# Patient Record
Sex: Male | Born: 1965
Health system: Southern US, Community
[De-identification: ages and names within clinical notes are randomized; demographics above are authoritative.]

## PROBLEM LIST (undated history)

## (undated) DIAGNOSIS — D497 Neoplasm of unspecified behavior of endocrine glands and other parts of nervous system: Secondary | ICD-10-CM

## (undated) DIAGNOSIS — D75A Glucose-6-phosphate dehydrogenase (G6PD) deficiency without anemia: Secondary | ICD-10-CM

## (undated) DIAGNOSIS — J302 Other seasonal allergic rhinitis: Secondary | ICD-10-CM

## (undated) DIAGNOSIS — M169 Osteoarthritis of hip, unspecified: Secondary | ICD-10-CM

## (undated) HISTORY — DX: Glucose-6-phosphate dehydrogenase (G6PD) deficiency without anemia: D75.A

---

## 2009-05-05 ENCOUNTER — Encounter: Payer: Self-pay | Admitting: Internal Medicine

## 2009-05-08 DIAGNOSIS — R9431 Abnormal electrocardiogram [ECG] [EKG]: Secondary | ICD-10-CM | POA: Insufficient documentation

## 2009-05-09 ENCOUNTER — Ambulatory Visit: Payer: Self-pay | Admitting: Internal Medicine

## 2009-05-12 ENCOUNTER — Ambulatory Visit (HOSPITAL_COMMUNITY): Admission: RE | Admit: 2009-05-12 | Discharge: 2009-05-12 | Payer: Self-pay | Admitting: Internal Medicine

## 2009-05-12 ENCOUNTER — Ambulatory Visit: Payer: Self-pay

## 2009-05-12 ENCOUNTER — Ambulatory Visit: Payer: Self-pay | Admitting: Cardiology

## 2009-05-12 ENCOUNTER — Encounter (HOSPITAL_COMMUNITY): Admission: RE | Admit: 2009-05-12 | Discharge: 2009-08-01 | Payer: Self-pay | Admitting: Internal Medicine

## 2010-05-26 NOTE — Assessment & Plan Note (Signed)
Summary: np6/dx:abnormal ekg/lg   Primary Provider:  [facility]  CC:  abnormal EKG.  History of Present Illness: Mr. Ivan [name] is a 45 y/o male with h/o G-6PD deficiency referred by Dr. Perrin Maltese for further evlaution of an abnormal ECG.  Denies history of HTN, HL or known CAD. Has never had a stress test or cath. No family history of heart disease. Remains fairly active.  Exercises regulalry on treadmill and ellipitical. On TM goes at 0.5% no CP or SOB with exercise. Did have some CP a while back which he related to GERD . No problems with swelling. No palpitations, syncope or presyncope. No FHX of sudden cardiac death.  Went for routine check-up and found to have abnormal ECG. Thinks he may have had an ECG 20 years ago for job interview but didn't know result  .  Preventive Screening-Counseling & Management  Alcohol-Tobacco     Smoking Status: never  Caffeine-Diet-Exercise     Does Patient Exercise: yes      Drug Use:  no.    Current Medications (verified): 1)  None  Allergies (verified): No Known Drug Allergies  Past History:  Past Medical History: G6PD deficiency GERD  Family History: Reviewed history and no changes required. father deceased with auto accident mother and sister alive no FHX of CAD of sudden cardiac death  Social History: Reviewed history and no changes required. Full Time -- pres of a Physiological scientist Married  -- with 4 children Tobacco Use - No.  Alcohol Use - yes Regular Exercise - yes -- 3 x's weekly Drug Use - no Smoking Status:  never Does Patient Exercise:  yes Drug Use:  no  Review of Systems       As per HPI and past medical history; otherwise all systems negative.   Vital Signs:  Patient profile:   45 year old male Height:      71 inches Weight:      256 pounds BMI:     35.83 Pulse rate:   74 / minute BP sitting:   112 / 78  (right arm) Cuff size:   large  Vitals Entered By: Hardin Negus, RMA (May 09, 2009 11:08  AM)   Impression & Recommendations:  Problem # 1:  ABNORMAL EKG (ICD-794.31) I suspect his ECG represents an abnormal repol pattern but does not have any HTN or other factors to explain. Will check echo to evaluate cardiac structure and function. Also needs screening treadmill, given abnormal ECG will need Myoview component.   Other Orders: EKG w/ Interpretation (93000) Echocardiogram (Echo) Nuclear Stress Test (Nuc Stress Test)  Patient Instructions: 1)  Your physician has requested that you have an echocardiogram.  Echocardiography is a painless test that uses sound waves to create images of your heart. It provides your doctor with information about the size and shape of your heart and how well your heart's chambers and valves are working.  This procedure takes approximately one hour. There are no restrictions for this procedure. 2)  Your physician has requested that you have an exercise stress myoview.  For further information please visit [email or web address]  Please follow instruction sheet, as given.

## 2010-05-26 NOTE — Assessment & Plan Note (Signed)
Summary: Cardiology Nuclear Study  Nuclear Med Background Indications for Stress Test: Evaluation for Ischemia, Abnormal EKG     Symptoms: Chest Pain, Chest Pressure    Nuclear Pre-Procedure Caffeine/Decaff Intake: None NPO After: 10:00 PM Lungs: Clear IV 0.9% NS with Angio Cath: 22g     IV Site: (R) Hand IV Started by: Irean Hong RN Chest Size (in) 54     Height (in): 71 Weight (lb): 253 BMI: 35.41  Nuclear Med Study 1 or 2 day study:  1 day     Stress Test Type:  Stress Reading MD:  Marca Ancona, MD     Referring MD:  D. Bensimhon Resting Radionuclide:  Technetium 41m Tetrofosmin     Resting Radionuclide Dose:  10.8 mCi  Stress Radionuclide:  Technetium 78m Tetrofosmin     Stress Radionuclide Dose:  33.0 mCi   Stress Protocol Exercise Time (min):  7:01 min     Max HR:  169 bpm     Predicted Max HR:  177 bpm  Max Systolic BP: 154 mm Hg     Percent Max HR:  95.48 %     METS: 8.6 Rate Pressure Product:  16109    Stress Test Technologist:  Irean Hong RN     Nuclear Technologist:  Harlow Asa CNMT  Rest Procedure  Myocardial perfusion imaging was performed at rest 45 minutes following the intravenous administration of Myoview Technetium 23m Tetrofosmin.  Stress Procedure  The patient exercised for 7 minutes and 01 second, RPE=15.  The patient stopped due to DOE, Fatigue,  and denied any chest pain.  There were  significant ST-T wave changes. The BP dropped to 125/60 immediately post exercise. Myoview was injected at peak exercise and myocardial perfusion imaging was performed after a brief delay.  QPS Raw Data Images:  Normal; no motion artifact; normal heart/lung ratio. Stress Images:  Mild mid anteroseptal perfusion defect.  Rest Images:  Mild mid anteroseptal perfusion defect Subtraction (SDS):  Fixede mid anteroseptal defect.  Transient Ischemic Dilatation:  .95  (Normal <1.22)  Lung/Heart Ratio:  .28  (Normal <0.45)  Quantitative Gated Spect Images QGS  EDV:  97 ml QGS ESV:  33 ml QGS EF:  66 % QGS cine images:  Normal wall motion.    Overall Impression  Exercise Capacity: Fair exercise capacity. BP Response: Normal blood pressure response. Clinical Symptoms: Fatigue and thigh tightness, no chest pain.  ECG Impression: Insignificant upsloping ST segment depression. Overall Impression: Fixed mild mid anteroseptal defect in the setting of normal wall motion may represent attenuation.  Cannot fullly rule out prior infarction, however.  No ischemia.   Appended Document: Cardiology Nuclear Study ok  Appended Document: Cardiology Nuclear Study Left message to call back   Appended Document: Cardiology Nuclear Study pts wife aware

## 2010-05-26 NOTE — Consult Note (Signed)
Summary: [facility] Ref[facility] Form  [facility] Ref[facility] Form   Imported By: Roderic Ovens 05/09/2009 15:58:55  _____________________________________________________________________  External Attachment:    Type:   Image     Comment:   External Document

## 2013-04-21 ENCOUNTER — Ambulatory Visit (INDEPENDENT_AMBULATORY_CARE_PROVIDER_SITE_OTHER): Payer: 59 | Admitting: Physician Assistant

## 2013-04-21 VITALS — BP 140/68 | HR 88 | Temp 98.1°F | Resp 18 | Ht 70.0 in | Wt 279.0 lb

## 2013-04-21 DIAGNOSIS — B9789 Other viral agents as the cause of diseases classified elsewhere: Secondary | ICD-10-CM

## 2013-04-21 DIAGNOSIS — R509 Fever, unspecified: Secondary | ICD-10-CM

## 2013-04-21 DIAGNOSIS — R05 Cough: Secondary | ICD-10-CM

## 2013-04-21 DIAGNOSIS — R0981 Nasal congestion: Secondary | ICD-10-CM

## 2013-04-21 DIAGNOSIS — J3489 Other specified disorders of nose and nasal sinuses: Secondary | ICD-10-CM

## 2013-04-21 DIAGNOSIS — R059 Cough, unspecified: Secondary | ICD-10-CM

## 2013-04-21 DIAGNOSIS — B349 Viral infection, unspecified: Secondary | ICD-10-CM

## 2013-04-21 LAB — POCT CBC
Granulocyte percent: 59.7 %G (ref 37–80)
HCT, POC: 41.1 % — AB (ref 43.5–53.7)
Hemoglobin: 12.4 g/dL — AB (ref 14.1–18.1)
Lymph, poc: 2.8 (ref 0.6–3.4)
MCH, POC: 28.8 pg (ref 27–31.2)
MCHC: 30.2 g/dL — AB (ref 31.8–35.4)
MCV: 95.3 fL (ref 80–97)
MID (cbc): 1 — AB (ref 0–0.9)
MPV: 8.8 fL (ref 0–99.8)
POC Granulocyte: 5.6 (ref 2–6.9)
POC LYMPH PERCENT: 30 %L (ref 10–50)
POC MID %: 10.3 %M (ref 0–12)
Platelet Count, POC: 279 10*3/uL (ref 142–424)
RBC: 4.31 M/uL — AB (ref 4.69–6.13)
RDW, POC: 13.2 %
WBC: 9.3 10*3/uL (ref 4.6–10.2)

## 2013-04-21 LAB — POCT INFLUENZA A/B
Influenza A, POC: NEGATIVE
Influenza B, POC: NEGATIVE

## 2013-04-21 MED ORDER — BENZONATATE 100 MG PO CAPS: 100.0000 mg | ORAL_CAPSULE | Freq: Three times a day (TID) | ORAL | Status: DC | PRN

## 2013-04-21 MED ORDER — HYDROCOD POLST-CHLORPHEN POLST 10-8 MG/5ML PO LQCR: 5.0000 mL | Freq: Two times a day (BID) | ORAL | Status: DC | PRN

## 2013-04-21 MED ORDER — AZITHROMYCIN 250 MG PO TABS: ORAL_TABLET | ORAL | Status: DC

## 2013-04-21 NOTE — Progress Notes (Signed)
Subjective:    Patient ID: RUFFIN [name], [name]    DOB: [birth year], 47 y.o.   MRN: [id number]  HPI 47 year old [name] presents with 3 day history of nasal congestion, fever, chills, body aches, productive cough, SOB, and fatigue.  Symptoms started suddenly on Ivan [name]/24/14 and have progressively worsened. He has had fevers up to 101.0 especially at night. Admits he seems to do better during the day and has been taking ibuprofen as needed for fever. Denies wheezing, chest pain, nausea, vomiting, sinus pain, otalgia, headache, or abdominal pain.  Did not get flu shot this year.  Patient is otherwise doing well with no other concerns today.     Review of Systems  Constitutional: Positive for fever and chills.  HENT: Positive for congestion, ear pain, postnasal drip and rhinorrhea. Negative for sinus pressure and sore throat.   Respiratory: Positive for cough and shortness of breath. Negative for chest tightness and wheezing.   Cardiovascular: Negative for chest pain.  Gastrointestinal: Negative for nausea, vomiting and abdominal pain.  Neurological: Negative for dizziness and headaches.       Objective:   Physical Exam  Constitutional: He is oriented to person, place, and time. He appears well-developed and well-nourished.  HENT:  Head: Normocephalic and atraumatic.  Right Ear: External ear normal.  Left Ear: External ear normal.  Mouth/Throat: Oropharynx is clear and moist. No oropharyngeal exudate.  Eyes: Conjunctivae are normal.  Neck: Normal range of motion. Neck supple.  Cardiovascular: Normal rate, regular rhythm and normal heart sounds.   Pulmonary/Chest: Effort normal and breath sounds normal.  Lymphadenopathy:    He has no cervical adenopathy.  Neurological: He is alert and oriented to person, place, and time.  Psychiatric: He has a normal mood and affect. His behavior is normal. Judgment and thought content normal.    Results for orders placed in visit on Ivan [name]/27/14  POCT CBC        Result Value Range   WBC 9.3  4.6 - 10.2 K/uL   Lymph, poc 2.8  0.6 - 3.4   POC LYMPH PERCENT 30.0  10 - 50 %L   MID (cbc) 1.0 (*) 0 - 0.9   POC MID % 10.3  0 - Ivan [name] %M   POC Granulocyte 5.6  2 - 6.9   Granulocyte percent 59.7  37 - 80 %G   RBC 4.31 (*) 4.69 - 6.13 M/uL   Hemoglobin Ivan [name].4 (*) 14.1 - 18.1 g/dL   HCT, POC 40.9 (*) 81.1 - 53.7 %   MCV 95.3  80 - 97 fL   MCH, POC 28.8  27 - 31.2 pg   MCHC 30.2 (*) 31.8 - 35.4 g/dL   RDW, POC 91.4     Platelet Count, POC 279  142 - 424 K/uL   MPV 8.8  0 - 99.8 fL  POCT INFLUENZA A/B      Result Value Range   Influenza A, POC Negative     Influenza B, POC Negative           Assessment & Plan:  Cough - Plan: POCT CBC, benzonatate (TESSALON) 100 MG capsule, chlorpheniramine-HYDROcodone (TUSSIONEX PENNKINETIC ER) 10-8 MG/5ML LQCR  Fever, unspecified - Plan: POCT CBC, POCT Influenza A/B  Flu test negative but very likely influenza. He is outside of the window to start Tamiflu Tussionex q12hours prn cough. Tessalon perles tid prn cough Increase fluids and rest Mucinex as directed to help with congestion.  Rx for Zpack to start if symptoms  fail to improve in 48-72 hours. He has been instructed to return if he develops worsening SOB, dark colored sputum, or chest pain.

## 2013-07-04 ENCOUNTER — Other Ambulatory Visit: Payer: Self-pay | Admitting: Family Medicine

## 2013-07-04 ENCOUNTER — Ambulatory Visit
Admission: RE | Admit: 2013-07-04 | Discharge: 2013-07-04 | Disposition: A | Discharge status: Home / Self Care | Payer: 59 | Source: Ambulatory Visit | Attending: Family Medicine | Admitting: Family Medicine

## 2013-07-04 DIAGNOSIS — E229 Hyperfunction of pituitary gland, unspecified: Principal | ICD-10-CM

## 2013-07-04 DIAGNOSIS — R7989 Other specified abnormal findings of blood chemistry: Secondary | ICD-10-CM

## 2013-07-04 MED ORDER — GADOBENATE DIMEGLUMINE 529 MG/ML IV SOLN
10.0000 mL | Freq: Once | INTRAVENOUS | Status: AC | PRN
  Administered 2013-07-04: 10 mL via INTRAVENOUS

## 2013-07-11 DIAGNOSIS — D352 Benign neoplasm of pituitary gland: Secondary | ICD-10-CM | POA: Insufficient documentation

## 2013-08-16 DIAGNOSIS — E291 Testicular hypofunction: Secondary | ICD-10-CM | POA: Insufficient documentation

## 2015-04-27 DIAGNOSIS — D497 Neoplasm of unspecified behavior of endocrine glands and other parts of nervous system: Secondary | ICD-10-CM

## 2015-04-27 HISTORY — DX: Neoplasm of unspecified behavior of endocrine glands and other parts of nervous system: D49.7

## 2016-01-07 ENCOUNTER — Ambulatory Visit (INDEPENDENT_AMBULATORY_CARE_PROVIDER_SITE_OTHER): Payer: 59 | Admitting: Internal Medicine

## 2016-01-07 DIAGNOSIS — Z23 Encounter for immunization: Secondary | ICD-10-CM | POA: Diagnosis not present

## 2016-01-07 DIAGNOSIS — Z7189 Other specified counseling: Secondary | ICD-10-CM | POA: Diagnosis not present

## 2016-01-07 DIAGNOSIS — Z789 Other specified health status: Secondary | ICD-10-CM | POA: Diagnosis not present

## 2016-01-07 DIAGNOSIS — Z9189 Other specified personal risk factors, not elsewhere classified: Secondary | ICD-10-CM

## 2016-01-07 DIAGNOSIS — IMO0002 Reserved for concepts with insufficient information to code with codable children: Secondary | ICD-10-CM

## 2016-01-07 DIAGNOSIS — Z0000 Encounter for general adult medical examination without abnormal findings: Secondary | ICD-10-CM

## 2016-01-07 MED ORDER — AZITHROMYCIN 500 MG PO TABS: 500.0000 mg | ORAL_TABLET | Freq: Every day | ORAL | 0 refills | Status: DC

## 2016-01-07 NOTE — Progress Notes (Signed)
   RFV: pre travel counseling for [place] Patient ID: Ivan [name], male   DOB: October 25, 1965, 50 y.o.   MRN: [id number]  HPI 50yo M who is traveling with his wife to [place]. He has previously traveled to [place], [place], [place], [place], and [place]. Including [place] and [place]  They will leave on 9/23- thru oct 5th. [place], [place], and [place]. Not staying in any malaria endemic areas  Outpatient Encounter Prescriptions as of 01/07/2016  Medication Sig  . azithromycin (ZITHROMAX) 250 MG tablet Take 2 tabs PO x 1 dose, then 1 tab PO QD x 4 days  . benzonatate (TESSALON) 100 MG capsule Take 1-2 capsules (100-200 mg total) by mouth 3 (three) times daily as needed for cough.  . chlorpheniramine-HYDROcodone (TUSSIONEX PENNKINETIC ER) 10-8 MG/5ML LQCR Take 5 mLs by mouth every 12 (twelve) hours as needed for cough (cough).   No facility-administered encounter medications on file as of 01/07/2016.      Patient Active Problem List   Diagnosis Date Noted  . ABNORMAL EKG 05/08/2009    Assessment and Plan  Pre travel vaccination = will give hep a , typhoid inj, and tdap  Traveler's diarrhea = will give rx for azithromycin  Gave precautions for mosquito bites, overal safety

## 2016-01-09 NOTE — Addendum Note (Signed)
Addended by: Landis Gandy on: 01/09/2016 11:42 AM   Modules accepted: Orders

## 2016-06-08 DIAGNOSIS — D352 Benign neoplasm of pituitary gland: Secondary | ICD-10-CM | POA: Diagnosis not present

## 2016-06-08 DIAGNOSIS — E669 Obesity, unspecified: Secondary | ICD-10-CM | POA: Insufficient documentation

## 2016-06-08 DIAGNOSIS — E291 Testicular hypofunction: Secondary | ICD-10-CM | POA: Diagnosis not present

## 2016-10-04 DIAGNOSIS — H5213 Myopia, bilateral: Secondary | ICD-10-CM | POA: Diagnosis not present

## 2016-12-07 DIAGNOSIS — D352 Benign neoplasm of pituitary gland: Secondary | ICD-10-CM | POA: Diagnosis not present

## 2016-12-07 DIAGNOSIS — E291 Testicular hypofunction: Secondary | ICD-10-CM | POA: Diagnosis not present

## 2016-12-07 DIAGNOSIS — R946 Abnormal results of thyroid function studies: Secondary | ICD-10-CM | POA: Diagnosis not present

## 2017-01-17 DIAGNOSIS — E237 Disorder of pituitary gland, unspecified: Secondary | ICD-10-CM | POA: Diagnosis not present

## 2017-01-17 DIAGNOSIS — D352 Benign neoplasm of pituitary gland: Secondary | ICD-10-CM | POA: Diagnosis not present

## 2017-08-30 DIAGNOSIS — M25552 Pain in left hip: Secondary | ICD-10-CM | POA: Diagnosis not present

## 2017-08-30 DIAGNOSIS — M1612 Unilateral primary osteoarthritis, left hip: Secondary | ICD-10-CM | POA: Diagnosis not present

## 2017-09-05 DIAGNOSIS — M1612 Unilateral primary osteoarthritis, left hip: Secondary | ICD-10-CM | POA: Diagnosis not present

## 2017-10-12 DIAGNOSIS — M1612 Unilateral primary osteoarthritis, left hip: Secondary | ICD-10-CM | POA: Diagnosis not present

## 2017-10-14 ENCOUNTER — Other Ambulatory Visit: Payer: Self-pay | Admitting: Orthopaedic Surgery

## 2017-11-10 NOTE — Pre-Procedure Instructions (Signed)
GOVANNI PLEMONS  11/10/2017      [id number] [address], [place] [address] Phone: [phone] Fax: [phone]    Your procedure is scheduled on Tues.,, November 22, 2017 from 7:30AM-9:49AM  Report to [facility] Admitting Entrance "A" at 5:30AM  Call this number if you have problems the morning of surgery:  [phone]   Remember:  Do not eat or drink after midnight on July 29th    Take these medicines the morning of surgery with A SIP OF WATER: None  7 days before surgery (7/23), stop taking all Other Aspirin Products, Vitamins, Fish oils, and Herbal medications. Also stop all NSAIDS i.e. Advil, Ibuprofen, Motrin, Aleve, Anaprox, Naproxen, BC, Goody Powders, and all Supplements.    Do not wear jewelry.  Do not wear lotions, powders, colognes, or deodorant.  Do not shave 48 hours prior to surgery.  Men may shave face.  Do not bring valuables to the hospital.  [facility] is not responsible for any belongings or valuables.  Contacts, dentures or bridgework may not be worn into surgery.  Leave your suitcase in the car.  After surgery it may be brought to your room.  For patients admitted to the hospital, discharge time will be determined by your treatment team.  Patients discharged the day of surgery will not be allowed to drive home.   Special instructions:  [facility]- Preparing For Surgery  Before surgery, you can play an important role. Because skin is not sterile, your skin needs to be as free of germs as possible. You can reduce the number of germs on your skin by washing with CHG (chlorahexidine gluconate) Soap before surgery.  CHG is an antiseptic cleaner which kills germs and bonds with the skin to continue killing germs even after washing.    Oral Hygiene is also important to reduce your risk of infection.  Remember - BRUSH YOUR TEETH THE MORNING OF SURGERY WITH YOUR REGULAR TOOTHPASTE  Please do  not use if you have an allergy to CHG or antibacterial soaps. If your skin becomes reddened/irritated stop using the CHG.  Do not shave (including legs and underarms) for at least 48 hours prior to first CHG shower. It is OK to shave your face.  Please follow these instructions carefully.   1. Shower the NIGHT BEFORE SURGERY and the MORNING OF SURGERY with CHG.   2. If you chose to wash your hair, wash your hair first as usual with your normal shampoo.  3. After you shampoo, rinse your hair and body thoroughly to remove the shampoo.  4. Use CHG as you would any other liquid soap. You can apply CHG directly to the skin and wash gently with a scrungie or a clean washcloth.   5. Apply the CHG Soap to your body ONLY FROM THE NECK DOWN.  Do not use on open wounds or open sores. Avoid contact with your eyes, ears, mouth and genitals (private parts). Wash Face and genitals (private parts)  with your normal soap.  6. Wash thoroughly, paying special attention to the area where your surgery will be performed.  7. Thoroughly rinse your body with warm water from the neck down.  8. DO NOT shower/wash with your normal soap after using and rinsing off the CHG Soap.  9. Pat yourself dry with a CLEAN TOWEL.  10. Wear CLEAN PAJAMAS to bed the night before surgery, wear comfortable clothes the morning of  surgery  11. Place CLEAN SHEETS on your bed the night of your first shower and DO NOT SLEEP WITH PETS.  Day of Surgery:  Do not apply any deodorants/lotions.  Please wear clean clothes to the hospital/surgery center.   Remember to brush your teeth WITH YOUR REGULAR TOOTHPASTE.  Please read over the following fact sheets that you were given. Pain Booklet, Coughing and Deep Breathing, MRSA Information and Surgical Site Infection Prevention

## 2017-11-11 ENCOUNTER — Encounter (HOSPITAL_COMMUNITY)
Admission: RE | Admit: 2017-11-11 | Discharge: 2017-11-11 | Disposition: A | Discharge status: Home / Self Care | Payer: 59 | Source: Ambulatory Visit | Attending: Orthopaedic Surgery | Admitting: Orthopaedic Surgery

## 2017-11-11 ENCOUNTER — Ambulatory Visit (HOSPITAL_COMMUNITY)
Admission: RE | Admit: 2017-11-11 | Discharge: 2017-11-11 | Disposition: A | Discharge status: Home / Self Care | Payer: 59 | Source: Ambulatory Visit | Attending: Orthopaedic Surgery | Admitting: Orthopaedic Surgery

## 2017-11-11 ENCOUNTER — Other Ambulatory Visit: Payer: Self-pay

## 2017-11-11 ENCOUNTER — Encounter (HOSPITAL_COMMUNITY): Payer: Self-pay

## 2017-11-11 DIAGNOSIS — E7401 von Gierke disease: Secondary | ICD-10-CM | POA: Diagnosis not present

## 2017-11-11 DIAGNOSIS — Z01818 Encounter for other preprocedural examination: Secondary | ICD-10-CM | POA: Insufficient documentation

## 2017-11-11 DIAGNOSIS — Z79899 Other long term (current) drug therapy: Secondary | ICD-10-CM | POA: Insufficient documentation

## 2017-11-11 DIAGNOSIS — Z0183 Encounter for blood typing: Secondary | ICD-10-CM | POA: Diagnosis not present

## 2017-11-11 DIAGNOSIS — I517 Cardiomegaly: Secondary | ICD-10-CM | POA: Insufficient documentation

## 2017-11-11 DIAGNOSIS — M1612 Unilateral primary osteoarthritis, left hip: Secondary | ICD-10-CM | POA: Diagnosis not present

## 2017-11-11 DIAGNOSIS — Z01812 Encounter for preprocedural laboratory examination: Secondary | ICD-10-CM | POA: Diagnosis not present

## 2017-11-11 HISTORY — DX: Osteoarthritis of hip, unspecified: M16.9

## 2017-11-11 LAB — BASIC METABOLIC PANEL
Anion gap: 11 (ref 5–15)
BUN: 15 mg/dL (ref 6–20)
CO2: 23 mmol/L (ref 22–32)
Calcium: 9.2 mg/dL (ref 8.9–10.3)
Chloride: 108 mmol/L (ref 98–111)
Creatinine, Ser: 1.11 mg/dL (ref 0.61–1.24)
GFR calc Af Amer: >60 mL/min (ref >60)
GFR calc non Af Amer: >60 mL/min (ref >60)
Glucose, Bld: 107 mg/dL — ABNORMAL HIGH (ref 70–99)
Potassium: 4.2 mmol/L (ref 3.5–5.1)
Sodium: 142 mmol/L (ref 135–145)

## 2017-11-11 LAB — CBC WITH DIFFERENTIAL/PLATELET
Abs Immature Granulocytes: 0 10*3/uL (ref 0.0–0.1)
Basophils Absolute: 0 10*3/uL (ref 0.0–0.1)
Basophils Relative: 1 %
Eosinophils Absolute: 0.2 10*3/uL (ref 0.0–0.7)
Eosinophils Relative: 4 %
HCT: 46 % (ref 39.0–52.0)
Hemoglobin: 14.7 g/dL (ref 13.0–17.0)
Immature Granulocytes: 0 %
Lymphocytes Relative: 32 %
Lymphs Abs: 1.6 10*3/uL (ref 0.7–4.0)
MCH: 29.5 pg (ref 26.0–34.0)
MCHC: 32 g/dL (ref 30.0–36.0)
MCV: 92.4 fL (ref 78.0–100.0)
Monocytes Absolute: 0.8 10*3/uL (ref 0.1–1.0)
Monocytes Relative: 15 %
Neutro Abs: 2.5 10*3/uL (ref 1.7–7.7)
Neutrophils Relative %: 48 %
Platelets: 237 10*3/uL (ref 150–400)
RBC: 4.98 MIL/uL (ref 4.22–5.81)
RDW: 12.7 % (ref 11.5–15.5)
WBC: 5.1 10*3/uL (ref 4.0–10.5)

## 2017-11-11 LAB — URINALYSIS, ROUTINE W REFLEX MICROSCOPIC
Bilirubin Urine: NEGATIVE
Glucose, UA: NEGATIVE mg/dL
Hgb urine dipstick: NEGATIVE
Ketones, ur: NEGATIVE mg/dL
Leukocytes, UA: NEGATIVE
Nitrite: NEGATIVE
Protein, ur: NEGATIVE mg/dL
Specific Gravity, Urine: 1.023 (ref 1.005–1.030)
pH: 5 (ref 5.0–8.0)

## 2017-11-11 LAB — PROTIME-INR
INR: 1.18
Prothrombin Time: 14.9 seconds (ref 11.4–15.2)

## 2017-11-11 LAB — ABO/RH: ABO/RH(D): B POS

## 2017-11-11 LAB — TYPE AND SCREEN
ABO/RH(D): B POS
Antibody Screen: NEGATIVE

## 2017-11-11 LAB — APTT: aPTT: 35 seconds (ref 24–36)

## 2017-11-11 LAB — SURGICAL PCR SCREEN
MRSA, PCR: NEGATIVE
Staphylococcus aureus: NEGATIVE

## 2017-11-11 NOTE — Progress Notes (Signed)
PCP - [facility]  Cardiologist - Denies  Chest x-ray - 11/11/17  EKG - 11/11/17  Stress Test - > 11yrs ago- Neg  ECHO - Denies  Cardiac Cath - Denies  Sleep Study - Denies CPAP - None  LABS- 11/11/17: CBC w/D, BMP, PT, PTT, T/S, UA  ASA- Denies  Anesthesia- Yes- EKG  Pt denies having chest pain, sob, or fever at this time. All instructions explained to the pt, with a verbal understanding of the material. Pt agrees to go over the instructions while at home for a better understanding. The opportunity to ask questions was provided.

## 2017-11-14 NOTE — Progress Notes (Signed)
Anesthesia Chart Review:  Case:  [id number] Date/Time:  11/22/17 0715   Procedure:  TOTAL HIP ARTHROPLASTY ANTERIOR APPROACH (Left )   Anesthesia type:  Spinal   Pre-op diagnosis:  LEFT HIP DEGENERATIVE JOINT DISEASE   Location:  [facility]   Surgeon:  Melrose Nakayama, MD      DISCUSSION: 52 yo male never smoker for above procedure. Pertinent medical hx includes G6PD deficiency.   I was asked to review chart due to abnormal EKG 11/11/2017 showing: Normal sinus rhythm, Left axis deviation, Left ventricular hypertrophy,ST & Marked T wave abnormality, consider anterolateral ischemia.   In 2011 he had a cardiac workup for eval of abnormal EKG that at that time also showed ST and T wave abnormalities indicative of lateral ischemia. He was asymptomatic and had no personal CAD history, EKG was done as part of routine exam. He saw Dr. Haroldine Laws had an echo showing EF of 55-60% and exercise stress test showing no ischemia.  EKG appears stable. He had previous cardiac workup with EF 55-60% and stress test negative for ischemia. Anticipate he can proceed with surgery as scheduled barring acute status change.   VS: BP (!) 139/95   Pulse 76   Temp 36.8 C   Resp 20   Ht 5\' 11"  (1.803 m)   Wt 259 lb (117.5 kg)   SpO2 98%   BMI 36.12 kg/m   PROVIDERS: Baruch Goldmann, PA-C is PCP   LABS: Labs reviewed: Acceptable for surgery. (all labs ordered are listed, but only abnormal results are displayed)  Labs Reviewed  BASIC METABOLIC PANEL - Abnormal; Notable for the following components:      Result Value   Glucose, Bld 107 (*)    All other components within normal limits  SURGICAL PCR SCREEN  APTT  CBC WITH DIFFERENTIAL/PLATELET  PROTIME-INR  URINALYSIS, ROUTINE W REFLEX MICROSCOPIC  TYPE AND SCREEN  ABO/RH     IMAGES: CHEST - 2 VIEW 11/11/2017  COMPARISON:  none  FINDINGS: Lungs clear.  Heart size and mediastinal contours are within normal limits.  No  effusion.  Visualized bones unremarkable.  IMPRESSION: No acute cardiopulmonary disease.   EKG: 11/11/2017: Normal sinus rhythm. Left axis deviation. Left ventricular hypertrophy. ST & Marked T wave abnormality, consider anterolateral ischemia  05/09/2009: Normal sinus rhythm.  Left axis deviation.  ST and T wave abnormality, consider anterolateral ischemia.  CV: ECHO 05/12/2009: Study conclusions -Left ventricle: Cavity size is normal.  Wall thickness was normal.  Systolic function was normal.  The estimated ejection fraction was in the range of 55 to 60%.  Wall motion was normal; there were no regional wall motion maladies.  Left ventricular diastolic function parameters were normal. -Aortic valve: There was no stenosis. -Mitral valve: Mild regurgitation. -Left atrium: Atrium normal in size. -Right ventricle: Cavity size is normal.  Systolic function was normal. -Pulmonary arteries: No TR Doppler jet so unable to estimate PA systolic pressure.  Impressions: -Normal LV size and systolic function, EF 55 to 60%.  Normal diastolic function.  Mild mitral regurgitation with a normal left atrial size.  Normal RV size and systolic function.  Exercise stress test 05/12/2009 Stress Procedure  The patient exercised for 7 minutes and 01 second, RPE=15.  The patient stopped due to DOE, Fatigue,  and denied any chest pain.  There were  significant ST-T wave changes. The BP dropped to 125/60 immediately post exercise. Myoview was injected at peak exercise and myocardial perfusion imaging was performed  after a brief delay.  QPS Raw Data Images:  Normal; no motion artifact; normal heart/lung ratio. Stress Images:  Mild mid anteroseptal perfusion defect.  Rest Images:  Mild mid anteroseptal perfusion defect Subtraction (SDS):  Fixede mid anteroseptal defect.  Transient Ischemic Dilatation:  .95  (Normal <1.22)  Lung/Heart Ratio:  .28  (Normal <0.45)  Quantitative Gated Spect Images QGS EDV:   97 ml QGS ESV:  33 ml QGS EF:  66 % QGS cine images:  Normal wall motion.    Overall Impression  Exercise Capacity: Fair exercise capacity. BP Response: Normal blood pressure response. Clinical Symptoms: Fatigue and thigh tightness, no chest pain.  ECG Impression: Insignificant upsloping ST segment depression. Overall Impression: Fixed mild mid anteroseptal defect in the setting of normal wall motion may represent attenuation.  Cannot fullly rule out prior infarction, however.  No ischemia.   Past Medical History:  Diagnosis Date  . Allergy   . Degenerative joint disease (DJD) of hip    Left    Past Surgical History:  Procedure Laterality Date  . NO PAST SURGERIES      MEDICATIONS: . cabergoline (DOSTINEX) 0.5 MG tablet  . ibuprofen (ADVIL,MOTRIN) 200 MG tablet  . Multiple Vitamin (MULTIVITAMIN) capsule  . TESTIM 50 MG/5GM (1%) GEL  . traMADol (ULTRAM) 50 MG tablet   No current facility-administered medications for this encounter.    Wynonia Musty [facility] Short Stay Center/Anesthesiology Phone [phone] 11/14/2017 1:39 PM

## 2017-11-17 NOTE — H&P (Signed)
TOTAL HIP ADMISSION H&P  Patient is admitted for left total hip arthroplasty.  Subjective:  Chief Complaint: left hip pain  HPI: Ivan [name], 52 y.o. male, has a history of pain and functional disability in the left hip(s) due to arthritis and patient has failed non-surgical conservative treatments for greater than 12 weeks to include NSAID's and/or analgesics, corticosteriod injections, flexibility and strengthening excercises, use of assistive devices, weight reduction as appropriate and activity modification.  Onset of symptoms was gradual starting 5 years ago with gradually worsening course since that time.The patient noted no past surgery on the left hip(s).  Patient currently rates pain in the left hip at 10 out of 10 with activity. Patient has night pain, worsening of pain with activity and weight bearing, trendelenberg gait, pain that interfers with activities of daily living and crepitus. Patient has evidence of subchondral cysts, subchondral sclerosis, periarticular osteophytes and joint space narrowing by imaging studies. This condition presents safety issues increasing the risk of falls. There is no current active infection.  Patient Active Problem List   Diagnosis Date Noted  . ABNORMAL EKG 05/08/2009   Past Medical History:  Diagnosis Date  . Allergy   . Degenerative joint disease (DJD) of hip    Left    Past Surgical History:  Procedure Laterality Date  . NO PAST SURGERIES      No current facility-administered medications for this encounter.    Current Outpatient Medications  Medication Sig Dispense Refill Last Dose  . cabergoline (DOSTINEX) 0.5 MG tablet Take 2 mg by mouth 2 (two) times a week. Take ONLY on Wed / Sat     . ibuprofen (ADVIL,MOTRIN) 200 MG tablet Take 800 mg by mouth every 6 (six) hours as needed for mild pain.     . Multiple Vitamin (MULTIVITAMIN) capsule Take 1 capsule by mouth daily.     . TESTIM 50 MG/5GM (1%) GEL USE 2 PACKETS DAILY  1   .  traMADol (ULTRAM) 50 MG tablet Take 1 tablet by mouth every 8 (eight) hours as needed for pain.  0 unknown   No Known Allergies  Social History   Tobacco Use  . Smoking status: Never Smoker  . Smokeless tobacco: Never Used  Substance Use Topics  . Alcohol use: Yes    Comment: occasional    Family History  Problem Relation Age of Onset  . Hypertension Mother   . Mental retardation Mother      Review of Systems  Musculoskeletal: Positive for joint pain.       Left hip  All other systems reviewed and are negative.   Objective:  Physical Exam  Constitutional: He is oriented to person, place, and time. He appears well-developed and well-nourished.  HENT:  Head: Normocephalic and atraumatic.  Eyes: Pupils are equal, round, and reactive to light.  Neck: Normal range of motion.  Cardiovascular: Normal rate and regular rhythm.  Respiratory: Effort normal.  GI: Soft.  Musculoskeletal:  Left hip motion is very painful and somewhat limited.  His leg lengths are roughly equal.  Sensation and motor function are intact distally.  He has palpable pulses in his feet.    Neurological: He is alert and oriented to person, place, and time.  Skin: Skin is warm and dry.  Psychiatric: He has a normal mood and affect. His behavior is normal. Judgment and thought content normal.    Vital signs in last 24 hours:    Labs:   Estimated body mass index is  36.12 kg/m as calculated from the following:   Height as of 11/11/17: 5\' 11"  (1.803 m).   Weight as of 11/11/17: 117.5 kg (259 lb).   Imaging Review Plain radiographs demonstrate severe degenerative joint disease of the left hip(s). The bone quality appears to be good for age and reported activity level.    Preoperative templating of the joint replacement has been completed, documented, and submitted to the Operating Room personnel in order to optimize intra-operative equipment management.     Assessment/Plan:  End stage primary  arthritis, left hip(s)  The patient history, physical examination, clinical judgement of the provider and imaging studies are consistent with end stage degenerative joint disease of the left hip(s) and total hip arthroplasty is deemed medically necessary. The treatment options including medical management, injection therapy, arthroscopy and arthroplasty were discussed at length. The risks and benefits of total hip arthroplasty were presented and reviewed. The risks due to aseptic loosening, infection, stiffness, dislocation/subluxation,  thromboembolic complications and other imponderables were discussed.  The patient acknowledged the explanation, agreed to proceed with the plan and consent was signed. Patient is being admitted for inpatient treatment for surgery, pain control, PT, OT, prophylactic antibiotics, VTE prophylaxis, progressive ambulation and ADL's and discharge planning.The patient is planning to be discharged home with home health services

## 2017-11-21 MED ORDER — TRANEXAMIC ACID 1000 MG/10ML IV SOLN
1000.0000 mg | INTRAVENOUS | Status: AC
  Administered 2017-11-22: 1000 mg via INTRAVENOUS
  Filled 2017-11-21: qty 1100

## 2017-11-21 MED ORDER — BUPIVACAINE LIPOSOME 1.3 % IJ SUSP
20.0000 mL | INTRAMUSCULAR | Status: AC
  Administered 2017-11-22: 20 mL
  Filled 2017-11-21: qty 20

## 2017-11-21 MED ORDER — SODIUM CHLORIDE 0.9 % IV SOLN
2000.0000 mg | INTRAVENOUS | Status: AC
  Administered 2017-11-22: 2000 mg via TOPICAL
  Filled 2017-11-21: qty 20

## 2017-11-21 MED ORDER — DEXTROSE 5 % IV SOLN
3.0000 g | INTRAVENOUS | Status: AC
  Administered 2017-11-22: 3 g via INTRAVENOUS
  Filled 2017-11-21: qty 3

## 2017-11-22 ENCOUNTER — Inpatient Hospital Stay (HOSPITAL_COMMUNITY)
Admission: RE | Admit: 2017-11-22 | Discharge: 2017-11-24 | DRG: 470 | Disposition: A | Discharge status: Home Health | Payer: 59 | Source: Ambulatory Visit | Attending: Orthopaedic Surgery | Admitting: Orthopaedic Surgery

## 2017-11-22 ENCOUNTER — Encounter (HOSPITAL_COMMUNITY)
Admission: RE | Disposition: A | Discharge status: Home Health | Payer: Self-pay | Source: Ambulatory Visit | Attending: Orthopaedic Surgery

## 2017-11-22 ENCOUNTER — Inpatient Hospital Stay (HOSPITAL_COMMUNITY): Payer: 59 | Admitting: Physician Assistant

## 2017-11-22 ENCOUNTER — Other Ambulatory Visit: Payer: Self-pay

## 2017-11-22 ENCOUNTER — Encounter (HOSPITAL_COMMUNITY): Payer: Self-pay | Admitting: General Practice

## 2017-11-22 ENCOUNTER — Inpatient Hospital Stay (HOSPITAL_COMMUNITY): Payer: 59

## 2017-11-22 ENCOUNTER — Inpatient Hospital Stay (HOSPITAL_COMMUNITY): Payer: 59 | Admitting: Anesthesiology

## 2017-11-22 DIAGNOSIS — M1612 Unilateral primary osteoarthritis, left hip: Principal | ICD-10-CM | POA: Diagnosis present

## 2017-11-22 DIAGNOSIS — Z96642 Presence of left artificial hip joint: Secondary | ICD-10-CM | POA: Diagnosis not present

## 2017-11-22 DIAGNOSIS — Z810 Family history of intellectual disabilities: Secondary | ICD-10-CM | POA: Diagnosis not present

## 2017-11-22 DIAGNOSIS — Z8249 Family history of ischemic heart disease and other diseases of the circulatory system: Secondary | ICD-10-CM

## 2017-11-22 DIAGNOSIS — Z6836 Body mass index (BMI) 36.0-36.9, adult: Secondary | ICD-10-CM

## 2017-11-22 DIAGNOSIS — Z419 Encounter for procedure for purposes other than remedying health state, unspecified: Secondary | ICD-10-CM

## 2017-11-22 DIAGNOSIS — E669 Obesity, unspecified: Secondary | ICD-10-CM | POA: Diagnosis not present

## 2017-11-22 DIAGNOSIS — Z471 Aftercare following joint replacement surgery: Secondary | ICD-10-CM | POA: Diagnosis not present

## 2017-11-22 DIAGNOSIS — M25552 Pain in left hip: Secondary | ICD-10-CM | POA: Diagnosis not present

## 2017-11-22 HISTORY — DX: Other seasonal allergic rhinitis: J30.2

## 2017-11-22 HISTORY — PX: TOTAL HIP ARTHROPLASTY: SHX124

## 2017-11-22 HISTORY — DX: Neoplasm of unspecified behavior of endocrine glands and other parts of nervous system: D49.7

## 2017-11-22 SURGERY — ARTHROPLASTY, HIP, TOTAL, ANTERIOR APPROACH
Anesthesia: Monitor Anesthesia Care | Laterality: Left

## 2017-11-22 MED ORDER — ONDANSETRON HCL 4 MG/2ML IJ SOLN: 4.0000 mg | Freq: Four times a day (QID) | INTRAMUSCULAR | Status: DC | PRN

## 2017-11-22 MED ORDER — CHLORHEXIDINE GLUCONATE 4 % EX LIQD: 60.0000 mL | Freq: Once | CUTANEOUS | Status: DC

## 2017-11-22 MED ORDER — CEFAZOLIN SODIUM-DEXTROSE 2-4 GM/100ML-% IV SOLN
INTRAVENOUS | Status: AC
  Filled 2017-11-22: qty 100

## 2017-11-22 MED ORDER — CEFAZOLIN SODIUM-DEXTROSE 2-4 GM/100ML-% IV SOLN
2.0000 g | Freq: Four times a day (QID) | INTRAVENOUS | Status: AC
  Administered 2017-11-22 (×2): 2 g via INTRAVENOUS
  Filled 2017-11-22: qty 100

## 2017-11-22 MED ORDER — HYDROCODONE-ACETAMINOPHEN 7.5-325 MG PO TABS: 1.0000 | ORAL_TABLET | ORAL | Status: DC | PRN

## 2017-11-22 MED ORDER — SODIUM CHLORIDE 0.9 % IV SOLN
INTRAVENOUS | Status: DC | PRN
  Administered 2017-11-22: 25 ug/min via INTRAVENOUS

## 2017-11-22 MED ORDER — PHENOL 1.4 % MT LIQD: 1.0000 | OROMUCOSAL | Status: DC | PRN

## 2017-11-22 MED ORDER — KETOROLAC TROMETHAMINE 15 MG/ML IJ SOLN
15.0000 mg | Freq: Four times a day (QID) | INTRAMUSCULAR | Status: AC
  Administered 2017-11-22 – 2017-11-23 (×4): 15 mg via INTRAVENOUS
  Filled 2017-11-22 (×3): qty 1

## 2017-11-22 MED ORDER — KETOROLAC TROMETHAMINE 15 MG/ML IJ SOLN
INTRAMUSCULAR | Status: AC
  Filled 2017-11-22: qty 1

## 2017-11-22 MED ORDER — ACETAMINOPHEN 500 MG PO TABS
500.0000 mg | ORAL_TABLET | Freq: Four times a day (QID) | ORAL | Status: AC
  Administered 2017-11-22 – 2017-11-23 (×3): 500 mg via ORAL
  Filled 2017-11-22 (×3): qty 1

## 2017-11-22 MED ORDER — MENTHOL 3 MG MT LOZG: 1.0000 | LOZENGE | OROMUCOSAL | Status: DC | PRN

## 2017-11-22 MED ORDER — LACTATED RINGERS IV SOLN: INTRAVENOUS | Status: DC

## 2017-11-22 MED ORDER — BISACODYL 5 MG PO TBEC: 5.0000 mg | DELAYED_RELEASE_TABLET | Freq: Every day | ORAL | Status: DC | PRN

## 2017-11-22 MED ORDER — MIDAZOLAM HCL 2 MG/2ML IJ SOLN
INTRAMUSCULAR | Status: DC | PRN
  Administered 2017-11-22: 2 mg via INTRAVENOUS

## 2017-11-22 MED ORDER — LACTATED RINGERS IV SOLN
INTRAVENOUS | Status: DC
  Administered 2017-11-22: 22:00:00 via INTRAVENOUS

## 2017-11-22 MED ORDER — PROPOFOL 10 MG/ML IV BOLUS
INTRAVENOUS | Status: AC
  Filled 2017-11-22: qty 20

## 2017-11-22 MED ORDER — DEXTROSE 5 % IV SOLN
500.0000 mg | Freq: Four times a day (QID) | INTRAVENOUS | Status: DC | PRN
  Filled 2017-11-22: qty 5

## 2017-11-22 MED ORDER — DIPHENHYDRAMINE HCL 12.5 MG/5ML PO ELIX: 12.5000 mg | ORAL_SOLUTION | ORAL | Status: DC | PRN

## 2017-11-22 MED ORDER — METOCLOPRAMIDE HCL 5 MG PO TABS: 5.0000 mg | ORAL_TABLET | Freq: Three times a day (TID) | ORAL | Status: DC | PRN

## 2017-11-22 MED ORDER — ALUM & MAG HYDROXIDE-SIMETH 200-200-20 MG/5ML PO SUSP: 30.0000 mL | ORAL | Status: DC | PRN

## 2017-11-22 MED ORDER — METOCLOPRAMIDE HCL 5 MG/ML IJ SOLN
5.0000 mg | Freq: Three times a day (TID) | INTRAMUSCULAR | Status: DC | PRN
  Administered 2017-11-22: 10 mg via INTRAVENOUS
  Filled 2017-11-22: qty 2

## 2017-11-22 MED ORDER — FENTANYL CITRATE (PF) 100 MCG/2ML IJ SOLN: 25.0000 ug | INTRAMUSCULAR | Status: DC | PRN

## 2017-11-22 MED ORDER — ONDANSETRON HCL 4 MG/2ML IJ SOLN
4.0000 mg | Freq: Once | INTRAMUSCULAR | Status: AC | PRN
  Administered 2017-11-22: 4 mg via INTRAVENOUS

## 2017-11-22 MED ORDER — ASPIRIN EC 325 MG PO TBEC
325.0000 mg | DELAYED_RELEASE_TABLET | Freq: Two times a day (BID) | ORAL | Status: DC
  Administered 2017-11-23 – 2017-11-24 (×3): 325 mg via ORAL
  Filled 2017-11-22 (×3): qty 1

## 2017-11-22 MED ORDER — ONDANSETRON HCL 4 MG/2ML IJ SOLN
INTRAMUSCULAR | Status: DC | PRN
  Administered 2017-11-22: 4 mg via INTRAVENOUS

## 2017-11-22 MED ORDER — FENTANYL CITRATE (PF) 250 MCG/5ML IJ SOLN
INTRAMUSCULAR | Status: DC | PRN
  Administered 2017-11-22: 50 ug via INTRAVENOUS

## 2017-11-22 MED ORDER — HYDROCODONE-ACETAMINOPHEN 5-325 MG PO TABS
1.0000 | ORAL_TABLET | ORAL | Status: DC | PRN
  Administered 2017-11-22 – 2017-11-24 (×6): 2 via ORAL
  Filled 2017-11-22 (×6): qty 2

## 2017-11-22 MED ORDER — MIDAZOLAM HCL 2 MG/2ML IJ SOLN
INTRAMUSCULAR | Status: AC
  Filled 2017-11-22: qty 2

## 2017-11-22 MED ORDER — ONDANSETRON HCL 4 MG/2ML IJ SOLN
INTRAMUSCULAR | Status: AC
  Filled 2017-11-22: qty 2

## 2017-11-22 MED ORDER — ONDANSETRON HCL 4 MG PO TABS: 4.0000 mg | ORAL_TABLET | Freq: Four times a day (QID) | ORAL | Status: DC | PRN

## 2017-11-22 MED ORDER — BUPIVACAINE-EPINEPHRINE (PF) 0.25% -1:200000 IJ SOLN
INTRAMUSCULAR | Status: AC
  Filled 2017-11-22: qty 30

## 2017-11-22 MED ORDER — 0.9 % SODIUM CHLORIDE (POUR BTL) OPTIME
TOPICAL | Status: DC | PRN
  Administered 2017-11-22: 1000 mL

## 2017-11-22 MED ORDER — MORPHINE SULFATE (PF) 2 MG/ML IV SOLN
0.5000 mg | INTRAVENOUS | Status: DC | PRN
  Administered 2017-11-22: 1 mg via INTRAVENOUS
  Filled 2017-11-22: qty 1

## 2017-11-22 MED ORDER — ACETAMINOPHEN 325 MG PO TABS
325.0000 mg | ORAL_TABLET | Freq: Four times a day (QID) | ORAL | Status: DC | PRN
  Administered 2017-11-23 (×2): 650 mg via ORAL
  Filled 2017-11-22 (×3): qty 2

## 2017-11-22 MED ORDER — FENTANYL CITRATE (PF) 250 MCG/5ML IJ SOLN
INTRAMUSCULAR | Status: AC
  Filled 2017-11-22: qty 5

## 2017-11-22 MED ORDER — BUPIVACAINE-EPINEPHRINE (PF) 0.25% -1:200000 IJ SOLN
INTRAMUSCULAR | Status: DC | PRN
  Administered 2017-11-22: 20 mL via PERINEURAL

## 2017-11-22 MED ORDER — METHOCARBAMOL 500 MG PO TABS
500.0000 mg | ORAL_TABLET | Freq: Four times a day (QID) | ORAL | Status: DC | PRN
  Administered 2017-11-22 – 2017-11-23 (×2): 500 mg via ORAL
  Filled 2017-11-22 (×2): qty 1

## 2017-11-22 MED ORDER — PROPOFOL 10 MG/ML IV BOLUS
INTRAVENOUS | Status: DC | PRN
  Administered 2017-11-22: 20 mg via INTRAVENOUS

## 2017-11-22 MED ORDER — DOCUSATE SODIUM 100 MG PO CAPS
100.0000 mg | ORAL_CAPSULE | Freq: Two times a day (BID) | ORAL | Status: DC
  Administered 2017-11-22 – 2017-11-24 (×4): 100 mg via ORAL
  Filled 2017-11-22 (×5): qty 1

## 2017-11-22 MED ORDER — PHENYLEPHRINE 40 MCG/ML (10ML) SYRINGE FOR IV PUSH (FOR BLOOD PRESSURE SUPPORT)
PREFILLED_SYRINGE | INTRAVENOUS | Status: DC | PRN
  Administered 2017-11-22: 80 ug via INTRAVENOUS

## 2017-11-22 MED ORDER — TRANEXAMIC ACID 1000 MG/10ML IV SOLN
1000.0000 mg | Freq: Once | INTRAVENOUS | Status: DC
  Filled 2017-11-22: qty 10

## 2017-11-22 MED ORDER — BUPIVACAINE IN DEXTROSE 0.75-8.25 % IT SOLN
INTRATHECAL | Status: DC | PRN
  Administered 2017-11-22: 2 mL via INTRATHECAL

## 2017-11-22 MED ORDER — LACTATED RINGERS IV SOLN
INTRAVENOUS | Status: DC | PRN
  Administered 2017-11-22: 07:00:00 via INTRAVENOUS

## 2017-11-22 MED ORDER — PROPOFOL 500 MG/50ML IV EMUL
INTRAVENOUS | Status: DC | PRN
  Administered 2017-11-22: 80 ug/kg/min via INTRAVENOUS

## 2017-11-22 SURGICAL SUPPLY — 49 items
BLADE SAW SGTL 18X1.27X75 (BLADE) ×2 IMPLANT
CAPT HIP TOTAL 2 ×1 IMPLANT
CELLS DAT CNTRL 66122 CELL SVR (MISCELLANEOUS) ×1 IMPLANT
COVER PERINEAL POST (MISCELLANEOUS) ×2 IMPLANT
COVER SURGICAL LIGHT HANDLE (MISCELLANEOUS) ×2 IMPLANT
DRAPE C-ARM 42X72 X-RAY (DRAPES) ×2 IMPLANT
DRAPE STERI IOBAN 125X83 (DRAPES) ×2 IMPLANT
DRAPE U-SHAPE 47X51 STRL (DRAPES) ×5 IMPLANT
DRESSING AQUACEL AG SP 3.5X6 (GAUZE/BANDAGES/DRESSINGS) IMPLANT
DRSG AQUACEL AG ADV 3.5X10 (GAUZE/BANDAGES/DRESSINGS) ×2 IMPLANT
DRSG AQUACEL AG SP 3.5X6 (GAUZE/BANDAGES/DRESSINGS) ×2
DRSG OPSITE POSTOP 4X6 (GAUZE/BANDAGES/DRESSINGS) ×2 IMPLANT
DURAPREP 26ML APPLICATOR (WOUND CARE) ×2 IMPLANT
ELECT BLADE 4.0 EZ CLEAN MEGAD (MISCELLANEOUS) ×2
ELECT REM PT RETURN 9FT ADLT (ELECTROSURGICAL) ×2
ELECTRODE BLDE 4.0 EZ CLN MEGD (MISCELLANEOUS) ×1 IMPLANT
ELECTRODE REM PT RTRN 9FT ADLT (ELECTROSURGICAL) ×1 IMPLANT
FACESHIELD WRAPAROUND (MASK) ×4 IMPLANT
FACESHIELD WRAPAROUND OR TEAM (MASK) ×2 IMPLANT
GLOVE BIO SURGEON STRL SZ8 (GLOVE) ×4 IMPLANT
GLOVE BIOGEL PI IND STRL 7.0 (GLOVE) IMPLANT
GLOVE BIOGEL PI IND STRL 8 (GLOVE) ×2 IMPLANT
GLOVE BIOGEL PI INDICATOR 7.0 (GLOVE) ×2
GLOVE BIOGEL PI INDICATOR 8 (GLOVE) ×2
GLOVE SURG SS PI 6.5 STRL IVOR (GLOVE) ×2 IMPLANT
GOWN STRL REUS W/ TWL LRG LVL3 (GOWN DISPOSABLE) ×1 IMPLANT
GOWN STRL REUS W/ TWL XL LVL3 (GOWN DISPOSABLE) ×2 IMPLANT
GOWN STRL REUS W/TWL LRG LVL3 (GOWN DISPOSABLE) ×2
GOWN STRL REUS W/TWL XL LVL3 (GOWN DISPOSABLE) ×4
HOOD PEEL AWAY FLYTE STAYCOOL (MISCELLANEOUS) ×1 IMPLANT
KIT BASIN OR (CUSTOM PROCEDURE TRAY) ×2 IMPLANT
KIT TURNOVER KIT B (KITS) ×2 IMPLANT
MANIFOLD NEPTUNE II (INSTRUMENTS) ×2 IMPLANT
NEEDLE HYPO 22GX1.5 SAFETY (NEEDLE) ×2 IMPLANT
NS IRRIG 1000ML POUR BTL (IV SOLUTION) ×2 IMPLANT
PACK TOTAL JOINT (CUSTOM PROCEDURE TRAY) ×2 IMPLANT
PAD ARMBOARD 7.5X6 YLW CONV (MISCELLANEOUS) ×2 IMPLANT
RETRACTOR WND ALEXIS 18 MED (MISCELLANEOUS) ×1 IMPLANT
RTRCTR WOUND ALEXIS 18CM MED (MISCELLANEOUS) ×2
SUT ETHIBOND NAB CT1 #1 30IN (SUTURE) ×5 IMPLANT
SUT VIC AB 1 CT1 27 (SUTURE) ×2
SUT VIC AB 1 CT1 27XBRD ANBCTR (SUTURE) ×1 IMPLANT
SUT VIC AB 2-0 CT1 27 (SUTURE) ×2
SUT VIC AB 2-0 CT1 TAPERPNT 27 (SUTURE) ×1 IMPLANT
SUT VIC AB 3-0 PS2 18 (SUTURE) ×2
SUT VIC AB 3-0 PS2 18XBRD (SUTURE) ×1 IMPLANT
SUT VLOC 180 0 24IN GS25 (SUTURE) ×2 IMPLANT
SYR 50ML LL SCALE MARK (SYRINGE) ×2 IMPLANT
TRAY CATH 16FR W/PLASTIC CATH (SET/KITS/TRAYS/PACK) ×1 IMPLANT

## 2017-11-22 NOTE — Op Note (Signed)
PRE-OP DIAGNOSIS:  LEFT HIP DEGENERATIVE JOINT DISEASE POST-OP DIAGNOSIS: same PROCEDURE:  LEFT TOTAL HIP ARTHROPLASTY ANTERIOR APPROACH ANESTHESIA:  Spinal and MAC SURGEON:  Melrose Nakayama MD ASSISTANT:  Loni Dolly PA-C   INDICATIONS FOR PROCEDURE:  The patient is a 52 y.o. male with a long history of a painful hip.  This has persisted despite multiple conservative measures.  The patient has persisted with pain and dysfunction making rest and activity difficult.  A total hip replacement is offered as surgical treatment.  Informed operative consent was obtained after discussion of possible complications including reaction to anesthesia, infection, neurovascular injury, dislocation, DVT, PE, and death.  The importance of the postoperative rehab program to optimize result was stressed with the patient.  SUMMARY OF FINDINGS AND PROCEDURE:  Under the above anesthesia through a anterior approach an the Hana table a left THR was performed.  The patient had severe degenerative change and excellent bone quality.  We used DePuy components to replace the hip and these were size 4 Actis femur capped with a +5 85m ceramic hip ball.  On the acetabular side we used a size 50 Gription shell with a plus 4 neutral polyethylene liner.  We did use a hole eliminator.  ALoni DollyPA-C assisted throughout and was invaluable to the completion of the case in that he helped position and retract while I performed the procedure.  He also closed simultaneously to help minimize OR time.  I used fluoroscopy throughout the case to check position of components and leg lengths and read all these views myself.  DESCRIPTION OF PROCEDURE:  The patient was taken to the OR suite where the above anesthetic was applied.  The patient was then positioned on the Hana table supine.  All bony prominences were appropriately padded.  Prep and drape was then performed in normal sterile fashion.  The patient was given kefzol preoperative antibiotic  and an appropriate time out was performed.  We then took an anterior approach to the left hip.  Dissection was taken through adipose to the tensor fascia lata fascia.  This structure was incised longitudinally and we dissected in the intermuscular interval just medial to this muscle.  Cobra retractors were placed superior and inferior to the femoral neck superficial to the capsule.  A capsular incision was then made and the retractors were placed along the femoral neck.  Xray was brought in to get a good level for the femoral neck cut which was made with an oscillating saw and osteotome.  The femoral head was removed with a corkscrew.  The acetabulum was exposed and some labral tissues were excised. Reaming was taken to the inside wall of the pelvis and sequentially up to 1 mm smaller than the actual component.  A trial of components was done and then the aforementioned acetabular shell was placed in appropriate tilt and anteversion confirmed by fluoroscopy. The liner was placed along with the hole eliminator and attention was turned to the femur.  The leg was brought down and over into adduction and the elevator bar was used to raise the femur up gently in the wound.  The piriformis was released with care taken to preserve the obturator internus attachment and all of the posterior capsule. The femur was reamed and then broached to the appropriate size.  A trial reduction was done and the aforementioned head and neck assembly gave uKoreathe best stability in extension with external rotation.  Leg lengths were felt to be about equal by fluoroscopic  exam.  The trial components were removed and the wound irrigated.  We then placed the femoral component in appropriate anteversion.  The head was applied to a dry stem neck and the hip again reduced.  It was again stable in the aforementioned position.  The would was irrigated again followed by re-approximation of anterior capsule with ethibond suture. Tensor fascia was  repaired with V-loc suture  followed by deep closure with #O and #2 undyed vicryl.  Skin was closed with subQ stitch and steristrips followed by a sterile dressing.  EBL and IOF can be obtained from anesthesia records.  DISPOSITION:  The patient was extubated in the OR and taken to PACU in stable condition to be admitted to the Orthopedic Surgery for appropriate post-op care to include perioperative antibiotics and DVT prophylaxis.

## 2017-11-22 NOTE — Evaluation (Signed)
Physical Therapy Evaluation Patient Details Name: Ivan [name] MRN: [id number] DOB: Aug 04, 1965 Today's Date: 11/22/2017   History of Present Illness  52 y.o. male admitted on 11/22/17 for elective L THA direct anterior approach.  Pt with no other significant PMH listed in his chart.   Clinical Impression  Pt is POD #0 and had a syncopal episode with PT while seated and while standing attempting to urinate.  RN in room during session and wife (who is an Charity fundraiser) was assisting.  Pt positioned back in bed, vitals stable and bed cleaned as he had a BM and vomited during episode.  RN continuing to assess and monitor pt when PT left. I anticipate, as he feels better, he will be able to progress home with his wife's assist.   PT to follow acutely for deficits listed below.      Follow Up Recommendations Follow surgeon's recommendation for DC plan and follow-up therapies;Supervision for mobility/OOB    Equipment Recommendations  Rolling walker with 5" wheels;3in1 (PT)    Recommendations for Other Services   NA    Precautions / Restrictions Precautions Precautions: Other (comment) Precaution Comments: passed out on eval, monitor vitals and pt closely.  Restrictions Weight Bearing Restrictions: Yes LLE Weight Bearing: Weight bearing as tolerated      Mobility  Bed Mobility Overal bed mobility: Needs Assistance Bed Mobility: Supine to Sit;Sit to Supine;Rolling Rolling: Mod assist   Supine to sit: Mod assist;HOB elevated Sit to supine: Total assist   General bed mobility comments: Mod assist to help progress left leg to EOB and separately help support trunk to come to sitting EOB.  Pt able to scoot out once seated, total assist of two to get pt back into the bed as he became unresponsive in sitting.  Mod assist to roll bil at end of session to change bed linens as pt had a small BM and vomited while sitting EOB.   Transfers Overall transfer level: Needs assistance Equipment used: Rolling  walker (2 wheeled) Transfers: Sit to/from Stand Sit to Stand: Mod assist;+2 physical assistance         General transfer comment: Mod assist to stand EOB to attempt to urinate.  Pt quickly became less responsive and therapist and wife had to forcefully assist him to sit down.  He remained responsive for a1-2 mins in seated and then became unresponsive again until positioned back in supine.  RN in room, vitals assessed and were stable.  Pt with increased temp and questionable urinary retention.  Wife is an Charity fundraiser and assisting at bedside.    Ambulation/Gait             General Gait Details: did not assess due to pt too lightheaded, N/V,  passed out, and had to be positioned back in supine in the bed.         Balance Overall balance assessment: Needs assistance Sitting-balance support: Feet supported;Bilateral upper extremity supported Sitting balance-Leahy Scale: Poor Sitting balance - Comments: min assist EOB.  Pt is hot to the touch   Standing balance support: Bilateral upper extremity supported Standing balance-Leahy Scale: Poor Standing balance comment: mod assist in standing with support of the RW.                              Pertinent Vitals/Pain Pain Assessment: Faces Faces Pain Scale: Hurts even more Pain Location: left hip with mobility.  Pain Descriptors / Indicators: Guarding;Grimacing Pain Intervention(s):  Limited activity within patient's tolerance;Monitored during session;Repositioned    Home Living Family/patient expects to be discharged to:: Private residence Living Arrangements: Spouse/significant other Available Help at Discharge: Family;Available 24 hours/day Type of Home: House Home Access: Stairs to enter Entrance Stairs-Rails: None Entrance Stairs-Number of Steps: 2 Home Layout: Two level;1/2 bath on main level;Bed/bath upstairs Home Equipment: None      Prior Function Level of Independence: Independent         Comments: owns his  own business, 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  Pt drives and enjoys swimming.      Hand Dominance   Dominant Hand: Right    Extremity/Trunk Assessment   Upper Extremity Assessment Upper Extremity Assessment: Overall WFL for tasks assessed    Lower Extremity Assessment Lower Extremity Assessment: LLE deficits/detail LLE Deficits / Details: left leg with normal post op pain and weakness, not specifically tested as pt needed to urinate urgently and then became lightheaded and unresponsive EOB.      Cervical / Trunk Assessment Cervical / Trunk Assessment: Normal  Communication   Communication: No difficulties  Cognition Arousal/Alertness: Lethargic Behavior During Therapy: WFL for tasks assessed/performed Overall Cognitive Status: (not specifically tested)                                 General Comments: Pt nauseated, lethargic, wanting to urinate, getting lighteheaded      General Comments      Exercises     Assessment/Plan    PT Assessment Patient needs continued PT services  PT Problem List Decreased strength;Decreased range of motion;Decreased activity tolerance;Decreased balance;Decreased mobility;Decreased knowledge of use of DME;Pain       PT Treatment Interventions DME instruction;Gait training;Stair training;Functional mobility training;Therapeutic activities;Therapeutic exercise;Balance training;Patient/family education;Manual techniques;Modalities    PT Goals (Current goals can be found in the Care Plan section)  Acute Rehab PT Goals Patient Stated Goal: wife intially wanted him to go home tomorrow, still hopeful.  PT Goal Formulation: With family Time For Goal Achievement: 11/29/17 Potential to Achieve Goals: Good    Frequency 7X/week           AM-PAC PT "6 Clicks" Daily Activity  Outcome Measure Difficulty turning over in bed (including adjusting bedclothes, sheets and blankets)?:  Unable Difficulty moving from lying on back to sitting on the side of the bed? : Unable Difficulty sitting down on and standing up from a chair with arms (e.g., wheelchair, bedside commode, etc,.)?: Unable Help needed moving to and from a bed to chair (including a wheelchair)?: Total Help needed walking in hospital room?: Total Help needed climbing 3-5 steps with a railing? : Total 6 Click Score: 6    End of Session   Activity Tolerance: Patient limited by lethargy;Patient limited by pain;Patient limited by fatigue Patient left: in bed;with call bell/phone within reach;with family/visitor present Nurse Communication: (RN in room entire session. ) PT Visit Diagnosis: Muscle weakness (generalized) (M62.81);Difficulty in walking, not elsewhere classified (R26.2);Pain Pain - Right/Left: Left Pain - part of body: Hip    Time: 4098-1191 PT Time Calculation (min) (ACUTE ONLY): 31 min   Charges:         Lurena Joiner B. Macenzie Burford, PT, DPT [phone]   PT Evaluation $PT Eval Low Complexity: 1 Low PT Treatments $Therapeutic Activity: 8-22 mins       11/22/2017, 5:51 PM

## 2017-11-22 NOTE — Anesthesia Procedure Notes (Signed)
Spinal  Patient location during procedure: OR Start time: 11/22/2017 7:35 AM End time: 11/22/2017 7:45 AM Staffing Anesthesiologist: Murvin Natal, MD Performed: anesthesiologist  Preanesthetic Checklist Completed: patient identified, surgical consent, pre-op evaluation, timeout performed, IV checked, risks and benefits discussed and monitors and equipment checked Spinal Block Patient position: sitting Prep: DuraPrep Patient monitoring: cardiac monitor, continuous pulse ox and blood pressure Approach: midline Location: L4-5 Injection technique: single-shot Needle Needle type: Pencan  Needle gauge: 24 G Needle length: 9 cm Assessment Sensory level: T10 Additional Notes Functioning IV was confirmed and monitors were applied. Sterile prep and drape, including hand hygiene and sterile gloves were used. The patient was positioned and the spine was prepped. The skin was anesthetized with lidocaine.  Free flow of clear CSF was obtained prior to injecting local anesthetic into the CSF.  The spinal needle aspirated freely following injection.  The needle was carefully withdrawn.  The patient tolerated the procedure well.

## 2017-11-22 NOTE — Anesthesia Postprocedure Evaluation (Signed)
Anesthesia Post Note  Patient: Ivan [name]  Procedure(s) Performed: TOTAL HIP ARTHROPLASTY ANTERIOR APPROACH (Left )     Patient location during evaluation: PACU Anesthesia Type: Spinal Level of consciousness: oriented and awake and alert Pain management: pain level controlled Vital Signs Assessment: post-procedure vital signs reviewed and stable Respiratory status: spontaneous breathing, respiratory function stable and patient connected to nasal cannula oxygen Cardiovascular status: blood pressure returned to baseline and stable Postop Assessment: no headache, no backache, no apparent nausea or vomiting and spinal receding Anesthetic complications: no    Last Vitals:  Vitals:   11/22/17 1215 11/22/17 1315  BP: (!) 106/91 117/68  Pulse: 74 88  Resp: 16 17  Temp: (!) 36.4 C   SpO2:  100%    Last Pain:  Vitals:   11/22/17 1315  TempSrc:   PainSc: 0-No pain                 Ryan P Ellender

## 2017-11-22 NOTE — Transfer of Care (Signed)
Immediate Anesthesia Transfer of Care Note  Patient: Ivan [name]  Procedure(s) Performed: TOTAL HIP ARTHROPLASTY ANTERIOR APPROACH (Left )  Patient Location: PACU  Anesthesia Type:General  Level of Consciousness: awake, alert  and oriented  Airway & Oxygen Therapy: Patient Spontanous Breathing and Patient connected to nasal cannula oxygen  Post-op Assessment: Report given to RN and Post -op Vital signs reviewed and stable  Post vital signs: Reviewed and stable  Last Vitals:  Vitals Value Taken Time  BP 98/63 11/22/2017  9:52 AM  Temp    Pulse    Resp 15 11/22/2017  9:53 AM  SpO2    Vitals shown include unvalidated device data.  Last Pain:  Vitals:   11/22/17 0633  TempSrc: Oral  PainSc: 5       Patients Stated Pain Goal: 3 (71/29/29 0903)  Complications: No apparent anesthesia complications

## 2017-11-22 NOTE — Anesthesia Preprocedure Evaluation (Addendum)
Anesthesia Evaluation  Patient identified by MRN, date of birth, ID band Patient awake    Reviewed: Allergy & Precautions, NPO status , Patient's Chart, lab work & pertinent test results  Airway Mallampati: IV  TM Distance: >3 FB Neck ROM: Full    Dental no notable dental hx.    Pulmonary neg pulmonary ROS,    Pulmonary exam normal breath sounds clear to auscultation       Cardiovascular negative cardio ROS Normal cardiovascular exam Rhythm:Regular Rate:Normal  ECG: NSR, LAD, LVH, rate 65   Neuro/Psych negative neurological ROS  negative psych ROS   GI/Hepatic negative GI ROS, Neg liver ROS,   Endo/Other  negative endocrine ROS  Renal/GU negative Renal ROS     Musculoskeletal  (+) Arthritis , Osteoarthritis,    Abdominal (+) + obese,   Peds  Hematology negative hematology ROS (+)   Anesthesia Other Findings LEFT HIP DEGENERATIVE JOINT DISEASE  Reproductive/Obstetrics                            Anesthesia Physical Anesthesia Plan  ASA: II  Anesthesia Plan: Spinal   Post-op Pain Management:    Induction: Intravenous  PONV Risk Score and Plan: 1 and Ondansetron, Dexamethasone, Propofol infusion, Treatment may vary due to age or medical condition and Midazolam  Airway Management Planned: Natural Airway  Additional Equipment:   Intra-op Plan:   Post-operative Plan:   Informed Consent: I have reviewed the patients History and Physical, chart, labs and discussed the procedure including the risks, benefits and alternatives for the proposed anesthesia with the patient or authorized representative who has indicated his/her understanding and acceptance.   Dental advisory given  Plan Discussed with: CRNA  Anesthesia Plan Comments:         Anesthesia Quick Evaluation

## 2017-11-22 NOTE — Progress Notes (Signed)
Ivan [name] is a 52 y.o. male patient admitted. Awake, alert - oriented  X 4 - no acute distress noted.  VSS - Blood pressure 124/80, pulse 97, temperature (Abnormal) 101.1 F (38.4 C), temperature source Oral, resp. rate 18, height 5\' 11"  (1.803 m), weight 117.5 kg (259 lb), SpO2 96 %.    IV in place, occlusive dsg intact without redness.  Loni Dolly, PA notified of temperature 101.1. Tylenol and IS encouraged. Bladder scanned patient with 60 cc of urine. Patient voided 150 cc.   Admission nurse notified of admission.     Will cont to eval and treat per MD orders.  Theora Master, RN 11/22/2017 6:25 PM

## 2017-11-22 NOTE — Interval H&P Note (Signed)
History and Physical Interval Note:  11/22/2017 7:13 AM  Ivan [name]  has presented today for surgery, with the diagnosis of LEFT HIP DEGENERATIVE JOINT DISEASE  The various methods of treatment have been discussed with the patient and family. After consideration of risks, benefits and other options for treatment, the patient has consented to  Procedure(s): TOTAL HIP ARTHROPLASTY ANTERIOR APPROACH (Left) as a surgical intervention .  The patient's history has been reviewed, patient examined, no change in status, stable for surgery.  I have reviewed the patient's chart and labs.  Questions were answered to the patient's satisfaction.     Erian Lariviere G

## 2017-11-23 ENCOUNTER — Encounter (HOSPITAL_COMMUNITY): Payer: Self-pay | Admitting: Orthopaedic Surgery

## 2017-11-23 LAB — CBC
HCT: 36.5 % — ABNORMAL LOW (ref 39.0–52.0)
Hemoglobin: 11.9 g/dL — ABNORMAL LOW (ref 13.0–17.0)
MCH: 29.6 pg (ref 26.0–34.0)
MCHC: 32.6 g/dL (ref 30.0–36.0)
MCV: 90.8 fL (ref 78.0–100.0)
Platelets: 161 10*3/uL (ref 150–400)
RBC: 4.02 MIL/uL — ABNORMAL LOW (ref 4.22–5.81)
RDW: 12.6 % (ref 11.5–15.5)
WBC: 9.5 10*3/uL (ref 4.0–10.5)

## 2017-11-23 NOTE — Progress Notes (Signed)
Physical Therapy Treatment Patient Details Name: Ivan [name] MRN: [id number] DOB: [birth year] Today's Date: 11/23/2017    History of Present Illness 52 y.o. male admitted on 11/22/17 for elective L THA direct anterior approach.  Pt with no other significant PMH listed in his chart.     PT Comments    Patient continues to make progress toward PT goals. Pt tolerated gait and stair training well and able to increase L LE weight bearing. Wife present and assisted with ascending/descending steps simulating home entrance. Pt will benefit from practicing stairs and review of HEP prior to d/c. Continue to progress as tolerated.    Follow Up Recommendations  Follow surgeon's recommendation for DC plan and follow-up therapies;Supervision for mobility/OOB     Equipment Recommendations  Rolling walker with 5" wheels;3in1 (PT)    Recommendations for Other Services       Precautions / Restrictions Restrictions Weight Bearing Restrictions: Yes LLE Weight Bearing: Weight bearing as tolerated    Mobility  Bed Mobility Overal bed mobility: Needs Assistance Bed Mobility: Supine to Sit;Sit to Supine     Supine to sit: HOB elevated;Min guard Sit to supine: Min guard   General bed mobility comments: pt able to bring bilat LE in/out of bed without physical assistance; increased time and effort and use of rail   Transfers Overall transfer level: Needs assistance Equipment used: Rolling walker (2 wheeled) Transfers: Sit to/from Stand Sit to Stand: Min guard         General transfer comment: min guard for safety  Ambulation/Gait Ambulation/Gait assistance: Min guard Gait Distance (Feet): 250 Feet Assistive device: Rolling walker (2 wheeled) Gait Pattern/deviations: Step-through pattern;Decreased stride length;Decreased weight shift to left(L LE circumduction) Gait velocity: decreased   General Gait Details: cues for increased L hip/knee flexion during swing phase; pt with decrease  in UE weight bearing this session   Stairs Stairs: Yes Stairs assistance: Min assist Stair Management: No rails;Step to pattern;Backwards;With walker Number of Stairs: 2 General stair comments: cues for sequencing and technique; wife stabilized RW   Wheelchair Mobility    Modified Rankin (Stroke Patients Only)       Balance Overall balance assessment: Needs assistance Sitting-balance support: Feet supported;Bilateral upper extremity supported Sitting balance-Leahy Scale: Fair     Standing balance support: Bilateral upper extremity supported;During functional activity Standing balance-Leahy Scale: Poor                              Cognition Arousal/Alertness: Awake/alert Behavior During Therapy: WFL for tasks assessed/performed Overall Cognitive Status: Within Functional Limits for tasks assessed                                        Exercises      General Comments        Pertinent Vitals/Pain Pain Assessment: 0-10 Pain Score: 6  Pain Location: L hip with mobility Pain Descriptors / Indicators: Guarding;Sore Pain Intervention(s): Limited activity within patient's tolerance;Monitored during session;Premedicated before session;Repositioned;Ice applied    Home Living                      Prior Function            PT Goals (current goals can now be found in the care plan section) Acute Rehab PT Goals PT Goal Formulation: With family Time For  Goal Achievement: 11/29/17 Potential to Achieve Goals: Good Progress towards PT goals: Progressing toward goals    Frequency    7X/week      PT Plan Current plan remains appropriate    Co-evaluation              AM-PAC PT "6 Clicks" Daily Activity  Outcome Measure  Difficulty turning over in bed (including adjusting bedclothes, sheets and blankets)?: Unable Difficulty moving from lying on back to sitting on the side of the bed? : Unable Difficulty sitting down on  and standing up from a chair with arms (e.g., wheelchair, bedside commode, etc,.)?: A Lot Help needed moving to and from a bed to chair (including a wheelchair)?: A Little Help needed walking in hospital room?: A Little Help needed climbing 3-5 steps with a railing? : A Little 6 Click Score: 13    End of Session Equipment Utilized During Treatment: Gait belt Activity Tolerance: Patient tolerated treatment well Patient left: with call bell/phone within reach;with family/visitor present;in chair Nurse Communication: Mobility status PT Visit Diagnosis: Muscle weakness (generalized) (M62.81);Difficulty in walking, not elsewhere classified (R26.2);Pain Pain - Right/Left: Left Pain - part of body: Hip     Time: 1410-1434 PT Time Calculation (min) (ACUTE ONLY): 24 min  Charges:  $Gait Training: 8-22 mins $Therapeutic Activity: 8-22 mins                     Earney Navy, PTA Pager: [phone]      Darliss Cheney 11/23/2017, 2:45 PM

## 2017-11-23 NOTE — Care Management Note (Addendum)
Case Management Note  Patient Details  Name: Ivan [name] MRN: [id number] Date of Birth: Jul 30, 1965  Subjective/Objective:                    Action/Plan:  Discussed discharge planning withpatient and wife [name] at bedside. Choice offered , they prefer [facility]. Gave referral to  [place] with [facility].  [employer] to deliver walker and 3 in 1 to room today Expected Discharge Date:                  Expected Discharge Plan:  [place]  In-House Referral:     Discharge planning Services  CM Consult  Post Acute Care Choice:  Home Health, Durable Medical Equipment Choice offered to:  Patient, Spouse  DME Arranged:  3-N-1, Walker rolling DME Agency:  [employer]  HH Arranged:  PT [place]:  [facility] [employer]  Status of Service:  Completed   If discussed at [employer] [facility] [employer], dates discussed:    Additional Comments:  Marilu Favre, RN 11/23/2017, 10:16 AM

## 2017-11-23 NOTE — Progress Notes (Signed)
Subjective: 1 Day Post-Op Procedure(s) (LRB): TOTAL HIP ARTHROPLASTY ANTERIOR APPROACH (Left)  Activity level:  wbat Diet tolerance:  ok Voiding:  ok Patient reports pain as mild.    Objective: Vital signs in last 24 hours: Temp:  [97 F (36.1 C)-102 F (38.9 C)] 102 F (38.9 C) (07/31 0544) Pulse Rate:  [50-107] 101 (07/31 0544) Resp:  [11-19] 18 (07/31 0544) BP: (83-158)/(51-91) 113/74 (07/31 0544) SpO2:  [95 %-100 %] 97 % (07/31 0544)  Labs: Recent Labs    11/23/17 0621  HGB 11.9*   Recent Labs    11/23/17 0621  WBC 9.5  RBC 4.02*  HCT 36.5*  PLT 161   No results for input(s): NA, K, CL, CO2, BUN, CREATININE, GLUCOSE, CALCIUM in the last 72 hours. No results for input(s): LABPT, INR in the last 72 hours.  Physical Exam:  Neurologically intact ABD soft Neurovascular intact Sensation intact distally Intact pulses distally Dorsiflexion/Plantar flexion intact Incision: dressing C/D/I and no drainage No cellulitis present Compartment soft  Assessment/Plan:  1 Day Post-Op Procedure(s) (LRB): TOTAL HIP ARTHROPLASTY ANTERIOR APPROACH (Left) Advance diet Up with therapy Plan for discharge tomorrow Discharge home with home health if doing well and cleared by PT. Continue on ASA 325mg  BID x 4 weeks post op. Patients CBC was normal this morning so I encouraged the use of IS and PO fluids. Fever is likely part of inflammatory process from THR Follow up in office 2 weeks post op.  Debborah Alonge, Larwance Sachs 11/23/2017, 7:45 AM

## 2017-11-23 NOTE — Progress Notes (Signed)
Patient with temperature of 101.8, PA Mitzi Hansen was made aware.

## 2017-11-23 NOTE — Progress Notes (Signed)
Orthopedic Tech Progress Note Patient Details:  Ivan [name] 1965/08/31 [id number]  Patient ID: [name], male   DOB: November 01, 1965, 52 y.o.   MRN: [id number] [employer] [facility]  Karolee Stamps 11/23/2017, 9:28 PM

## 2017-11-23 NOTE — Progress Notes (Signed)
Physical Therapy Treatment Patient Details Name: Ivan [name] MRN: [id number] DOB: January 15, 1966 Today's Date: 11/23/2017    History of Present Illness 52 y.o. male admitted on 11/22/17 for elective L THA direct anterior approach.  Pt with no other significant PMH listed in his chart.     PT Comments    Patient is progressing well toward PT goals. Pt tolerated session well and with no c/o dizziness. VSS with postural changes. Continue to progress as tolerated.    Follow Up Recommendations  Follow surgeon's recommendation for DC plan and follow-up therapies;Supervision for mobility/OOB     Equipment Recommendations  Rolling walker with 5" wheels;3in1 (PT)    Recommendations for Other Services       Precautions / Restrictions Restrictions Weight Bearing Restrictions: Yes LLE Weight Bearing: Weight bearing as tolerated    Mobility  Bed Mobility Overal bed mobility: Needs Assistance Bed Mobility: Supine to Sit     Supine to sit: HOB elevated;Min assist     General bed mobility comments: use of rail and min A to lift L LE for pt to bring to EOB; cues for sequencing   Transfers Overall transfer level: Needs assistance Equipment used: Rolling walker (2 wheeled) Transfers: Sit to/from Stand Sit to Stand: Min guard         General transfer comment: min guard for safety; cues for safe hand placement  Ambulation/Gait Ambulation/Gait assistance: Min guard Gait Distance (Feet): 200 Feet Assistive device: Rolling walker (2 wheeled) Gait Pattern/deviations: Step-through pattern;Decreased stride length;Decreased weight shift to left Gait velocity: decreased   General Gait Details: cues for posture and step length symmetry; pt with improved weightbearing and step through pattern with increased distance   Stairs             Wheelchair Mobility    Modified Rankin (Stroke Patients Only)       Balance Overall balance assessment: Needs assistance Sitting-balance  support: Feet supported;Bilateral upper extremity supported Sitting balance-Leahy Scale: Fair     Standing balance support: Bilateral upper extremity supported;During functional activity Standing balance-Leahy Scale: Poor                              Cognition Arousal/Alertness: Awake/alert Behavior During Therapy: WFL for tasks assessed/performed Overall Cognitive Status: Within Functional Limits for tasks assessed                                        Exercises      General Comments General comments (skin integrity, edema, etc.): VSS with postural changes and no c/o dizziness      Pertinent Vitals/Pain Pain Assessment: Faces Faces Pain Scale: Hurts little more Pain Location: L hip Pain Descriptors / Indicators: Guarding;Grimacing Pain Intervention(s): Limited activity within patient's tolerance;Monitored during session;Premedicated before session;Repositioned;Ice applied    Home Living                      Prior Function            PT Goals (current goals can now be found in the care plan section) Acute Rehab PT Goals PT Goal Formulation: With family Time For Goal Achievement: 11/29/17 Potential to Achieve Goals: Good Progress towards PT goals: Progressing toward goals    Frequency    7X/week      PT Plan Current plan remains appropriate  Co-evaluation              AM-PAC PT "6 Clicks" Daily Activity  Outcome Measure  Difficulty turning over in bed (including adjusting bedclothes, sheets and blankets)?: Unable Difficulty moving from lying on back to sitting on the side of the bed? : Unable Difficulty sitting down on and standing up from a chair with arms (e.g., wheelchair, bedside commode, etc,.)?: A Lot Help needed moving to and from a bed to chair (including a wheelchair)?: A Little Help needed walking in hospital room?: A Little Help needed climbing 3-5 steps with a railing? : A Little 6 Click Score:  13    End of Session Equipment Utilized During Treatment: Gait belt Activity Tolerance: Patient tolerated treatment well Patient left: with call bell/phone within reach;with family/visitor present;in chair Nurse Communication: Mobility status PT Visit Diagnosis: Muscle weakness (generalized) (M62.81);Difficulty in walking, not elsewhere classified (R26.2);Pain Pain - Right/Left: Left Pain - part of body: Hip     Time: 0925-1000 PT Time Calculation (min) (ACUTE ONLY): 35 min  Charges:  $Gait Training: 8-22 mins $Therapeutic Activity: 8-22 mins                     Earney Navy, PTA Pager: [phone]     Darliss Cheney 11/23/2017, 10:07 AM

## 2017-11-23 NOTE — Progress Notes (Addendum)
Pt's temp 102.0 this am. Notified Pricilla Holm PA on call for Dr. Rhona Raider. Stat CBC ordered and per Jed Limerick PA will be rounding on patient soon. Patient and his wife were informed about the lab and the PA rounding.

## 2017-11-24 MED ORDER — ONDANSETRON HCL 4 MG PO TABS: 4.0000 mg | ORAL_TABLET | Freq: Four times a day (QID) | ORAL | 0 refills | Status: DC | PRN

## 2017-11-24 MED ORDER — TIZANIDINE HCL 4 MG PO TABS: 4.0000 mg | ORAL_TABLET | Freq: Four times a day (QID) | ORAL | 1 refills | Status: AC | PRN

## 2017-11-24 MED ORDER — HYDROCODONE-ACETAMINOPHEN 7.5-325 MG PO TABS: 1.0000 | ORAL_TABLET | ORAL | 0 refills | Status: DC | PRN

## 2017-11-24 MED ORDER — BISACODYL 5 MG PO TBEC: 5.0000 mg | DELAYED_RELEASE_TABLET | Freq: Every day | ORAL | 0 refills | Status: DC | PRN

## 2017-11-24 MED ORDER — ASPIRIN 325 MG PO TBEC: 325.0000 mg | DELAYED_RELEASE_TABLET | Freq: Two times a day (BID) | ORAL | 0 refills | Status: DC

## 2017-11-24 MED ORDER — DOCUSATE SODIUM 100 MG PO CAPS: 100.0000 mg | ORAL_CAPSULE | Freq: Two times a day (BID) | ORAL | 0 refills | Status: DC

## 2017-11-24 NOTE — Progress Notes (Signed)
PT Cancellation Note  Patient Details Name: Ivan [name] MRN: [id number] DOB: [birth year]   Cancelled Treatment:    Reason Eval/Treat Not Completed: Other (comment). Asked by staff to see pt again for stair training, pt reports he feels comfortable with stairs and did not want to practice them again before being discharged. Noted from yesterdays PT session he required min A for 2 steps. If pt changes mind, PT will come back to attempt again.     Einar Crow 11/24/2017, 11:32 AM    Einar Crow, SPT  Student Physical Therapist Acute Rehab [phone]

## 2017-11-24 NOTE — Progress Notes (Signed)
Physical Therapy Treatment Patient Details Name: Ivan [name] MRN: [id number] DOB: 1965/12/14 Today's Date: 11/24/2017    History of Present Illness 52 y.o. male admitted on 11/22/17 for elective L THA direct anterior approach.  Pt with no other significant PMH listed in his chart.     PT Comments    Pt reports having just ambulated with mobility tech, and declines attempting stairs this session.  Reporting he's attempting to eat breakfast, but agreeable to performing HEP.  Provided pt with handout and reviewed via verbal/demonstration cues for 10 reps of exercises listed below.  Pt demonstrated each with min verbal cues for technique, pacing, and breathing.  Pt remains in recliner at end of session, call bell in reach, tray set up for breakfast.     Follow Up Recommendations  Follow surgeon's recommendation for DC plan and follow-up therapies;Supervision for mobility/OOB     Equipment Recommendations  Rolling walker with 5" wheels;3in1 (PT)    Recommendations for Other Services       Precautions / Restrictions Precautions Precautions: Anterior Hip Restrictions Weight Bearing Restrictions: Yes LLE Weight Bearing: Weight bearing as tolerated    Mobility  Bed Mobility                  Transfers                    Ambulation/Gait                 Stairs             Wheelchair Mobility    Modified Rankin (Stroke Patients Only)       Balance                                            Cognition Arousal/Alertness: Awake/alert Behavior During Therapy: WFL for tasks assessed/performed Overall Cognitive Status: Within Functional Limits for tasks assessed                                 General Comments: pt declining ambulation/stairs having just mobilized with mobility tech and attempting to eat breakfast      Exercises Total Joint Exercises Ankle Circles/Pumps: AROM;Both;10 reps;Seated Quad Sets:  AROM;Right;10 reps Gluteal Sets: AROM;Both;10 reps Heel Slides: AROM;Both;10 reps Hip ABduction/ADduction: AROM;10 reps;Both Straight Leg Raises: AROM;Both;10 reps Long Arc Quad: AROM;Both;10 reps    General Comments        Pertinent Vitals/Pain Pain Assessment: 0-10 Pain Score: 3  Pain Location: L hip with mobility Pain Descriptors / Indicators: Guarding;Sore Pain Intervention(s): Limited activity within patient's tolerance;Monitored during session    Home Living                      Prior Function            PT Goals (current goals can now be found in the care plan section) Acute Rehab PT Goals PT Goal Formulation: With family Time For Goal Achievement: 11/29/17 Potential to Achieve Goals: Good Progress towards PT goals: Progressing toward goals    Frequency    7X/week      PT Plan Current plan remains appropriate    Co-evaluation              AM-PAC PT "6 Clicks" Daily Activity  Outcome Measure  Difficulty turning over in bed (including adjusting bedclothes, sheets and blankets)?: A Lot Difficulty moving from lying on back to sitting on the side of the bed? : A Lot Difficulty sitting down on and standing up from a chair with arms (e.g., wheelchair, bedside commode, etc,.)?: A Lot Help needed moving to and from a bed to chair (including a wheelchair)?: A Little Help needed walking in hospital room?: A Little Help needed climbing 3-5 steps with a railing? : A Little 6 Click Score: 15    End of Session   Activity Tolerance: Patient limited by pain Patient left: in chair;with call bell/phone within reach;with family/visitor present Nurse Communication: Mobility status PT Visit Diagnosis: Muscle weakness (generalized) (M62.81);Difficulty in walking, not elsewhere classified (R26.2);Pain Pain - Right/Left: Left Pain - part of body: Hip     Time: 4103-0131 PT Time Calculation (min) (ACUTE ONLY): 10 min  Charges:  $Therapeutic Exercise:  8-22 mins                        Michel Santee 11/24/2017, 9:55 AM

## 2017-11-24 NOTE — Discharge Summary (Signed)
Patient ID: AWAB ABEBE MRN: [id number] DOB/AGE: 52-Oct-1967 52 y.o.  Admit date: 11/22/2017 Discharge date: 11/24/2017  Admission Diagnoses:  Principal Problem:   Primary localized osteoarthritis of left hip Active Problems:   Primary osteoarthritis of left hip   Discharge Diagnoses:  Same  Past Medical History:  Diagnosis Date  . Degenerative joint disease (DJD) of hip    Left  . Pituitary tumor 2017   "took RX to shrink it" (11/22/2017)  . Seasonal allergies    "spring; pollen" (11/22/2017)    Surgeries: Procedure(s): TOTAL HIP ARTHROPLASTY ANTERIOR APPROACH on 11/22/2017   Consultants:   Discharged Condition: Improved  Hospital Course: Ivan [name] is an 52 y.o. male who was admitted 11/22/2017 for operative treatment ofPrimary localized osteoarthritis of left hip. Patient has severe unremitting pain that affects sleep, daily activities, and work/hobbies. After pre-op clearance the patient was taken to the operating room on 11/22/2017 and underwent  Procedure(s): TOTAL HIP ARTHROPLASTY ANTERIOR APPROACH.    Patient was given perioperative antibiotics:  Anti-infectives (From admission, onward)   Start     Dose/Rate Route Frequency Ordered Stop   11/22/17 1630  ceFAZolin (ANCEF) IVPB 2g/100 mL premix     2 g 200 mL/hr over 30 Minutes Intravenous Every 6 hours 11/22/17 1617 11/22/17 2253   11/22/17 1614  ceFAZolin (ANCEF) 2-4 GM/100ML-% IVPB    Note to Pharmacy:  Valetta Mole   : cabinet override      11/22/17 1614 11/23/17 0429   11/22/17 0645  ceFAZolin (ANCEF) 3 g in dextrose 5 % 50 mL IVPB     3 g 100 mL/hr over 30 Minutes Intravenous To [facility] 11/21/17 1134 11/22/17 0745       Patient was given sequential compression devices, early ambulation, and chemoprophylaxis to prevent DVT.  Patient benefited maximally from hospital stay and there were no complications.    Recent vital signs:  Patient Vitals for the past 24 hrs:  BP Temp Temp src  Pulse Resp SpO2  11/24/17 1054 - (!) 100.8 F (38.2 C) Oral - - -  11/24/17 0912 - 100.2 F (37.9 C) Oral - - -  11/24/17 0601 (!) 145/94 99.5 F (37.5 C) Oral (!) 105 19 94 %  11/24/17 0324 - (!) 100.9 F (38.3 C) - - - -  11/23/17 2131 140/85 98.9 F (37.2 C) Oral (!) 106 18 95 %  11/23/17 2053 - 100 F (37.8 C) Oral - - -  11/23/17 1700 - (!) 101.8 F (38.8 C) Oral - - -  11/23/17 1615 - 100.2 F (37.9 C) - - - -  11/23/17 1414 137/80 100 F (37.8 C) Oral (!) 107 16 97 %     Recent laboratory studies:  Recent Labs    11/23/17 0621  WBC 9.5  HGB 11.9*  HCT 36.5*  PLT 161     Discharge Medications:   Allergies as of 11/24/2017   No Known Allergies     Medication List    STOP taking these medications   ibuprofen 200 MG tablet Commonly known as:  ADVIL,MOTRIN     TAKE these medications   aspirin 325 MG EC tablet Take 1 tablet (325 mg total) by mouth 2 (two) times daily after a meal.   bisacodyl 5 MG EC tablet Commonly known as:  DULCOLAX Take 1 tablet (5 mg total) by mouth daily as needed for moderate constipation.   cabergoline 0.5 MG tablet Commonly known as:  DOSTINEX Take 2 mg by  mouth 2 (two) times a week. Take ONLY on Wed / Sat   docusate sodium 100 MG capsule Commonly known as:  COLACE Take 1 capsule (100 mg total) by mouth 2 (two) times daily.   HYDROcodone-acetaminophen 7.5-325 MG tablet Commonly known as:  NORCO Take 1-2 tablets by mouth every 4 (four) hours as needed for severe pain (pain score 7-10).   multivitamin capsule Take 1 capsule by mouth daily.   ondansetron 4 MG tablet Commonly known as:  ZOFRAN Take 1 tablet (4 mg total) by mouth every 6 (six) hours as needed for nausea.   TESTIM 50 MG/5GM (1%) Gel Generic drug:  testosterone USE 2 PACKETS DAILY   tiZANidine 4 MG tablet Commonly known as:  ZANAFLEX Take 1 tablet (4 mg total) by mouth every 6 (six) hours as needed.   traMADol 50 MG tablet Commonly known as:   ULTRAM Take 1 tablet by mouth every 8 (eight) hours as needed for pain.            Durable Medical Equipment  (From admission, onward)        Start     Ordered   11/22/17 1729  DME Walker rolling  Once    Question:  Patient needs a walker to treat with the following condition  Answer:  Primary osteoarthritis of left hip   11/22/17 1728   11/22/17 1729  DME 3 n 1  Once     11/22/17 1728   11/22/17 1729  DME Bedside commode  Once    Question:  Patient needs a bedside commode to treat with the following condition  Answer:  Primary osteoarthritis of left hip   11/22/17 1728      Diagnostic Studies: Dg Chest 2 View  Result Date: 11/11/2017 CLINICAL DATA:  Pre admit for total hip surg,,no cardiac or lung dz,non smoker EXAM: CHEST - 2 VIEW COMPARISON:  none FINDINGS: Lungs clear. Heart size and mediastinal contours are within normal limits. No effusion. Visualized bones unremarkable. IMPRESSION: No acute cardiopulmonary disease. Electronically Signed   By: Lucrezia Europe M.D.   On: 11/11/2017 13:45   Dg C-arm 1-60 Min  Result Date: 11/22/2017 CLINICAL DATA:  52 year old male post hip arthroplasty. Initial encounter. EXAM: DG C-ARM 61-120 MIN; OPERATIVE LEFT HIP WITH PELVIS Fluoroscopic time: 21 seconds. COMPARISON:  None. FINDINGS: Two intraoperative C-arm views submitted for review after surgery. Post total left hip replacement which appears in satisfactory position without complication noted on frontal imaging. IMPRESSION: Post left hip replacement. Electronically Signed   By: Genia Del M.D.   On: 11/22/2017 09:32   Dg Hip Operative Unilat W Or W/o Pelvis Left  Result Date: 11/22/2017 CLINICAL DATA:  52 year old male post hip arthroplasty. Initial encounter. EXAM: DG C-ARM 61-120 MIN; OPERATIVE LEFT HIP WITH PELVIS Fluoroscopic time: 21 seconds. COMPARISON:  None. FINDINGS: Two intraoperative C-arm views submitted for review after surgery. Post total left hip replacement which appears  in satisfactory position without complication noted on frontal imaging. IMPRESSION: Post left hip replacement. Electronically Signed   By: Genia Del M.D.   On: 11/22/2017 09:32    Disposition: Discharge disposition: 01-Home or Self Care       Discharge Instructions    Call MD / Call 911   Complete by:  As directed    If you experience chest pain or shortness of breath, CALL 911 and be transported to the hospital emergency room.  If you develope a fever above 101 F, pus (white drainage) or  increased drainage or redness at the wound, or calf pain, call your surgeon's office.   Constipation Prevention   Complete by:  As directed    Drink plenty of fluids.  Prune juice may be helpful.  You may use a stool softener, such as Colace (over the counter) 100 mg twice a day.  Use MiraLax (over the counter) for constipation as needed.   Diet - low sodium heart healthy   Complete by:  As directed    Discharge instructions   Complete by:  As directed    INSTRUCTIONS AFTER JOINT REPLACEMENT   Remove items at home which could result in a fall. This includes throw rugs or furniture in walking pathways ICE to the affected joint every three hours while awake for 30 minutes at a time, for at least the first 3-5 days, and then as needed for pain and swelling.  Continue to use ice for pain and swelling. You may notice swelling that will progress down to the foot and ankle.  This is normal after surgery.  Elevate your leg when you are not up walking on it.   Continue to use the breathing machine you got in the hospital (incentive spirometer) which will help keep your temperature down.  It is common for your temperature to cycle up and down following surgery, especially at night when you are not up moving around and exerting yourself.  The breathing machine keeps your lungs expanded and your temperature down.   DIET:  As you were doing prior to hospitalization, we recommend a well-balanced diet.  DRESSING  / WOUND CARE / SHOWERING  You may shower 3 days after surgery, but keep the wounds dry during showering.  You may use an occlusive plastic wrap (Press'n Seal for example), NO SOAKING/SUBMERGING IN THE BATHTUB.  If the bandage gets wet, change with a clean dry gauze.  If the incision gets wet, pat the wound dry with a clean towel.  ACTIVITY  Increase activity slowly as tolerated, but follow the weight bearing instructions below.   No driving for 6 weeks or until further direction given by your physician.  You cannot drive while taking narcotics.  No lifting or carrying greater than 10 lbs. until further directed by your surgeon. Avoid periods of inactivity such as sitting longer than an hour when not asleep. This helps prevent blood clots.  You may return to work once you are authorized by your doctor.     WEIGHT BEARING   Weight bearing as tolerated with assist device (walker, cane, etc) as directed, use it as long as suggested by your surgeon or therapist, typically at least 4-6 weeks.   EXERCISES  Results after joint replacement surgery are often greatly improved when you follow the exercise, range of motion and muscle strengthening exercises prescribed by your doctor. Safety measures are also important to protect the joint from further injury. Any time any of these exercises cause you to have increased pain or swelling, decrease what you are doing until you are comfortable again and then slowly increase them. If you have problems or questions, call your caregiver or physical therapist for advice.   Rehabilitation is important following a joint replacement. After just a few days of immobilization, the muscles of the leg can become weakened and shrink (atrophy).  These exercises are designed to build up the tone and strength of the thigh and leg muscles and to improve motion. Often times heat used for twenty to thirty minutes before working out  will loosen up your tissues and help with  improving the range of motion but do not use heat for the first two weeks following surgery (sometimes heat can increase post-operative swelling).   These exercises can be done on a training (exercise) mat, on the floor, on a table or on a bed. Use whatever works the best and is most comfortable for you.    Use music or television while you are exercising so that the exercises are a pleasant break in your day. This will make your life better with the exercises acting as a break in your routine that you can look forward to.   Perform all exercises about fifteen times, three times per day or as directed.  You should exercise both the operative leg and the other leg as well.   Exercises include:   Quad Sets - Tighten up the muscle on the front of the thigh (Quad) and hold for 5-10 seconds.   Straight Leg Raises - With your knee straight (if you were given a brace, keep it on), lift the leg to 60 degrees, hold for 3 seconds, and slowly lower the leg.  Perform this exercise against resistance later as your leg gets stronger.  Leg Slides: Lying on your back, slowly slide your foot toward your buttocks, bending your knee up off the floor (only go as far as is comfortable). Then slowly slide your foot back down until your leg is flat on the floor again.  Angel Wings: Lying on your back spread your legs to the side as far apart as you can without causing discomfort.  Hamstring Strength:  Lying on your back, push your heel against the floor with your leg straight by tightening up the muscles of your buttocks.  Repeat, but this time bend your knee to a comfortable angle, and push your heel against the floor.  You may put a pillow under the heel to make it more comfortable if necessary.   A rehabilitation program following joint replacement surgery can speed recovery and prevent re-injury in the future due to weakened muscles. Contact your doctor or a physical therapist for more information on knee rehabilitation.     CONSTIPATION  Constipation is defined medically as fewer than three stools per week and severe constipation as less than one stool per week.  Even if you have a regular bowel pattern at home, your normal regimen is likely to be disrupted due to multiple reasons following surgery.  Combination of anesthesia, postoperative narcotics, change in appetite and fluid intake all can affect your bowels.   YOU MUST use at least one of the following options; they are listed in order of increasing strength to get the job done.  They are all available over the counter, and you may need to use some, POSSIBLY even all of these options:    Drink plenty of fluids (prune juice may be helpful) and high fiber foods Colace 100 mg by mouth twice a day  Senokot for constipation as directed and as needed Dulcolax (bisacodyl), take with full glass of water  Miralax (polyethylene glycol) once or twice a day as needed.  If you have tried all these things and are unable to have a bowel movement in the first 3-4 days after surgery call either your surgeon or your primary doctor.    If you experience loose stools or diarrhea, hold the medications until you stool forms back up.  If your symptoms do not get better within 1 week or  if they get worse, check with your doctor.  If you experience "the worst abdominal pain ever" or develop nausea or vomiting, please contact the office immediately for further recommendations for treatment.   ITCHING:  If you experience itching with your medications, try taking only a single pain pill, or even half a pain pill at a time.  You can also use Benadryl over the counter for itching or also to help with sleep.   TED HOSE STOCKINGS:  Use stockings on both legs until for at least 2 weeks or as directed by physician office. They may be removed at night for sleeping.  MEDICATIONS:  See your medication summary on the "After Visit Summary" that nursing will review with you.  You may have some  home medications which will be placed on hold until you complete the course of blood thinner medication.  It is important for you to complete the blood thinner medication as prescribed.  PRECAUTIONS:  If you experience chest pain or shortness of breath - call 911 immediately for transfer to the hospital emergency department.   If you develop a fever greater that 101 F, purulent drainage from wound, increased redness or drainage from wound, foul odor from the wound/dressing, or calf pain - CONTACT YOUR SURGEON.                                                   FOLLOW-UP APPOINTMENTS:  If you do not already have a post-op appointment, please call the office for an appointment to be seen by your surgeon.  Guidelines for how soon to be seen are listed in your "After Visit Summary", but are typically between 1-4 weeks after surgery.  OTHER INSTRUCTIONS:   Knee Replacement:  Do not place pillow under knee, focus on keeping the knee straight while resting. CPM instructions: 0-90 degrees, 2 hours in the morning, 2 hours in the afternoon, and 2 hours in the evening. Place foam block, curve side up under heel at all times except when in CPM or when walking.  DO NOT modify, tear, cut, or change the foam block in any way.  MAKE SURE YOU:  Understand these instructions.  Get help right away if you are not doing well or get worse.    Thank you for letting us be a part of your medical care team.  It is a privilege we respect greatly.  We hope these instructions will help you stay on track for a fast and full recovery!   Increase activity slowly as tolerated   Complete by:  As directed       Follow-up Information    Melrose Nakayama, MD. Schedule an appointment as soon as possible for a visit in 2 weeks.   Specialty:  Orthopedic Surgery Contact information: [place] [address] [phone]        [facility] Follow up.   Specialty:  [facility] Contact  information: [address] [address] [phone]            Signed: Rich Fuchs 11/24/2017, 11:58 AM

## 2017-11-24 NOTE — Progress Notes (Signed)
Discharge instructions (including medications) discussed with and copy provided to patient/caregiver 

## 2017-11-25 DIAGNOSIS — Z7982 Long term (current) use of aspirin: Secondary | ICD-10-CM | POA: Diagnosis not present

## 2017-11-25 DIAGNOSIS — Z96642 Presence of left artificial hip joint: Secondary | ICD-10-CM | POA: Diagnosis not present

## 2017-11-25 DIAGNOSIS — Z471 Aftercare following joint replacement surgery: Secondary | ICD-10-CM | POA: Diagnosis not present

## 2017-11-28 DIAGNOSIS — Z7982 Long term (current) use of aspirin: Secondary | ICD-10-CM | POA: Diagnosis not present

## 2017-11-28 DIAGNOSIS — Z471 Aftercare following joint replacement surgery: Secondary | ICD-10-CM | POA: Diagnosis not present

## 2017-11-28 DIAGNOSIS — Z96642 Presence of left artificial hip joint: Secondary | ICD-10-CM | POA: Diagnosis not present

## 2017-11-30 DIAGNOSIS — Z471 Aftercare following joint replacement surgery: Secondary | ICD-10-CM | POA: Diagnosis not present

## 2017-11-30 DIAGNOSIS — Z96642 Presence of left artificial hip joint: Secondary | ICD-10-CM | POA: Diagnosis not present

## 2017-11-30 DIAGNOSIS — Z7982 Long term (current) use of aspirin: Secondary | ICD-10-CM | POA: Diagnosis not present

## 2017-12-02 DIAGNOSIS — Z471 Aftercare following joint replacement surgery: Secondary | ICD-10-CM | POA: Diagnosis not present

## 2017-12-02 DIAGNOSIS — Z7982 Long term (current) use of aspirin: Secondary | ICD-10-CM | POA: Diagnosis not present

## 2017-12-02 DIAGNOSIS — M170 Bilateral primary osteoarthritis of knee: Secondary | ICD-10-CM | POA: Diagnosis not present

## 2017-12-02 DIAGNOSIS — Z96642 Presence of left artificial hip joint: Secondary | ICD-10-CM | POA: Diagnosis not present

## 2017-12-06 DIAGNOSIS — Z96642 Presence of left artificial hip joint: Secondary | ICD-10-CM | POA: Diagnosis not present

## 2017-12-06 DIAGNOSIS — Z7982 Long term (current) use of aspirin: Secondary | ICD-10-CM | POA: Diagnosis not present

## 2017-12-06 DIAGNOSIS — Z471 Aftercare following joint replacement surgery: Secondary | ICD-10-CM | POA: Diagnosis not present

## 2017-12-07 DIAGNOSIS — Z471 Aftercare following joint replacement surgery: Secondary | ICD-10-CM | POA: Diagnosis not present

## 2017-12-07 DIAGNOSIS — Z96642 Presence of left artificial hip joint: Secondary | ICD-10-CM | POA: Diagnosis not present

## 2017-12-07 DIAGNOSIS — R262 Difficulty in walking, not elsewhere classified: Secondary | ICD-10-CM | POA: Diagnosis not present

## 2017-12-13 DIAGNOSIS — Z471 Aftercare following joint replacement surgery: Secondary | ICD-10-CM | POA: Diagnosis not present

## 2017-12-13 DIAGNOSIS — R262 Difficulty in walking, not elsewhere classified: Secondary | ICD-10-CM | POA: Diagnosis not present

## 2017-12-13 DIAGNOSIS — Z96642 Presence of left artificial hip joint: Secondary | ICD-10-CM | POA: Diagnosis not present

## 2018-01-04 DIAGNOSIS — Z0000 Encounter for general adult medical examination without abnormal findings: Secondary | ICD-10-CM | POA: Diagnosis not present

## 2018-01-17 DIAGNOSIS — D352 Benign neoplasm of pituitary gland: Secondary | ICD-10-CM | POA: Diagnosis not present

## 2018-01-17 DIAGNOSIS — E291 Testicular hypofunction: Secondary | ICD-10-CM | POA: Diagnosis not present

## 2018-03-01 DIAGNOSIS — Z96642 Presence of left artificial hip joint: Secondary | ICD-10-CM | POA: Diagnosis not present

## 2018-03-22 DIAGNOSIS — E291 Testicular hypofunction: Secondary | ICD-10-CM | POA: Diagnosis not present

## 2018-03-22 DIAGNOSIS — D352 Benign neoplasm of pituitary gland: Secondary | ICD-10-CM | POA: Diagnosis not present

## 2018-06-29 DIAGNOSIS — D443 Neoplasm of uncertain behavior of pituitary gland: Secondary | ICD-10-CM | POA: Diagnosis not present

## 2018-07-03 DIAGNOSIS — K648 Other hemorrhoids: Secondary | ICD-10-CM | POA: Diagnosis not present

## 2018-07-03 DIAGNOSIS — D123 Benign neoplasm of transverse colon: Secondary | ICD-10-CM | POA: Diagnosis not present

## 2018-07-03 DIAGNOSIS — Z1211 Encounter for screening for malignant neoplasm of colon: Secondary | ICD-10-CM | POA: Diagnosis not present

## 2019-04-02 IMAGING — RF DG HIP (WITH PELVIS) OPERATIVE*L*
1 series · 2 of 2 positions shown · non-contrast
Comparison: None.

CLINICAL DATA: 52-year-old male post hip arthroplasty. Initial
encounter.

EXAM:
DG C-ARM 61-120 MIN; OPERATIVE LEFT HIP WITH PELVIS
Fluoroscopic time: 21 seconds.

[Series 1: run · 2 of 2 slices shown]
[im 1/2]
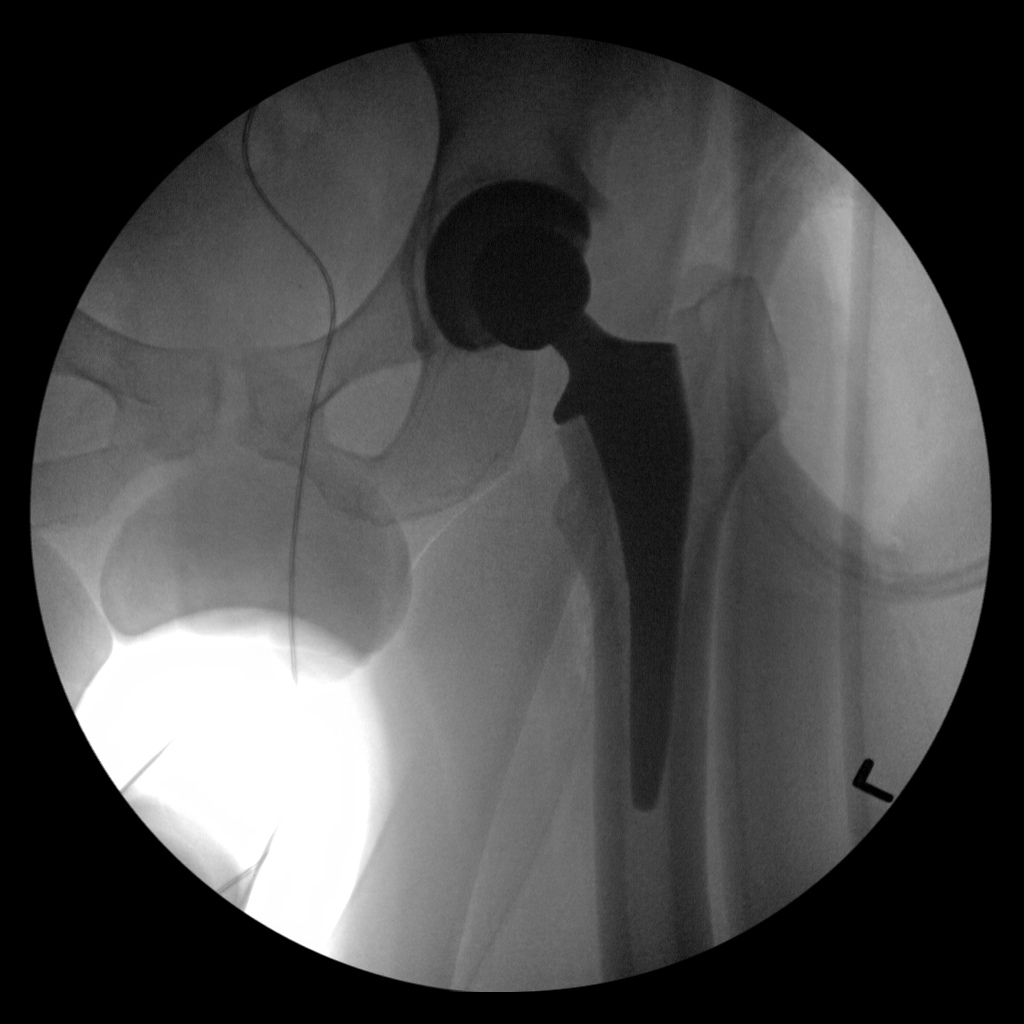
[im 2/2]
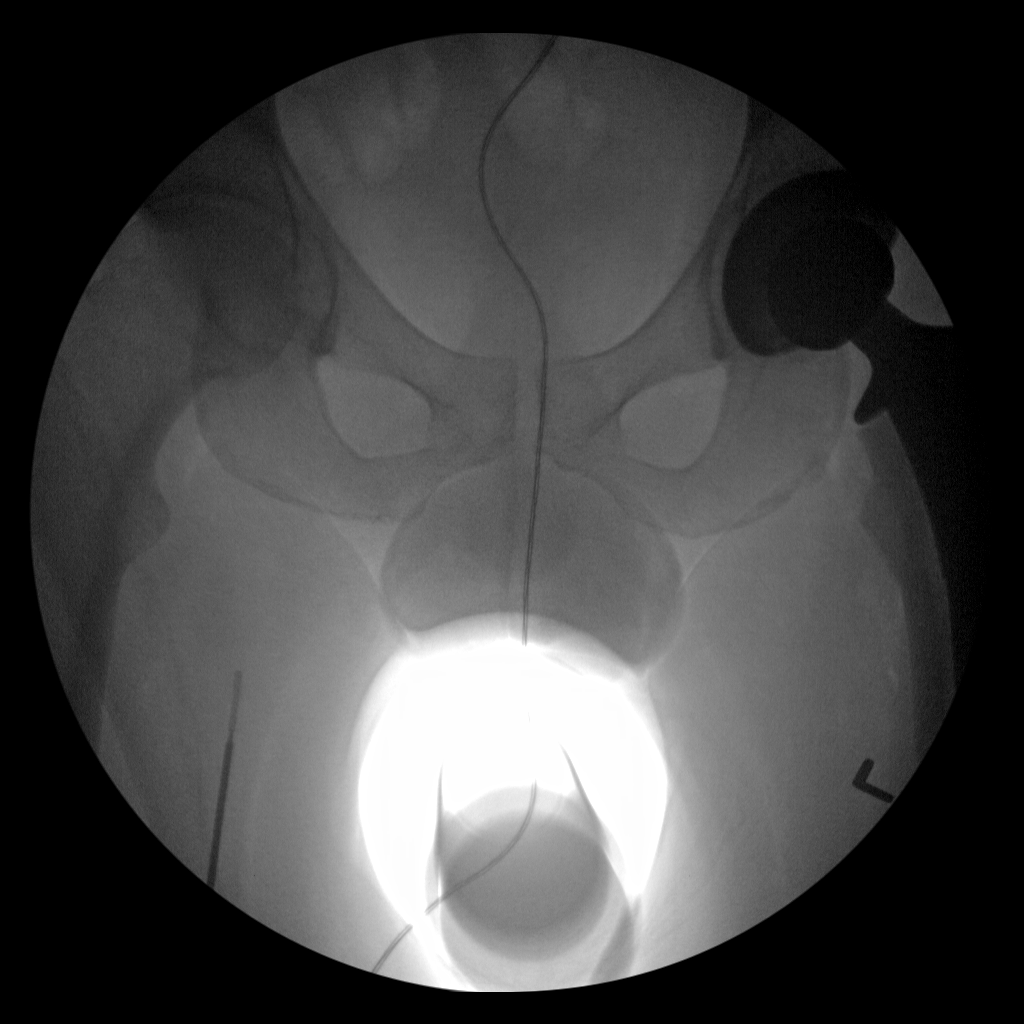

[2 of 2 positions shown; findings below may reference images not displayed]

FINDINGS: Two intraoperative C-arm views submitted for review after surgery.
Post total left hip replacement which appears in satisfactory
position without complication noted on frontal imaging.
IMPRESSION: Post left hip replacement.

## 2019-11-27 ENCOUNTER — Other Ambulatory Visit: Payer: Self-pay

## 2019-11-27 ENCOUNTER — Encounter (HOSPITAL_COMMUNITY): Payer: Self-pay

## 2019-11-27 ENCOUNTER — Ambulatory Visit (HOSPITAL_COMMUNITY)
Admission: EM | Admit: 2019-11-27 | Discharge: 2019-11-27 | Disposition: A | Discharge status: Home / Self Care | Payer: 59 | Attending: Urgent Care | Admitting: Urgent Care

## 2019-11-27 DIAGNOSIS — M546 Pain in thoracic spine: Secondary | ICD-10-CM | POA: Diagnosis not present

## 2019-11-27 DIAGNOSIS — M62830 Muscle spasm of back: Secondary | ICD-10-CM

## 2019-11-27 MED ORDER — MELOXICAM 7.5 MG PO TABS: 7.5000 mg | ORAL_TABLET | Freq: Every day | ORAL | 0 refills | Status: DC

## 2019-11-27 MED ORDER — TIZANIDINE HCL 4 MG PO TABS: 4.0000 mg | ORAL_TABLET | Freq: Three times a day (TID) | ORAL | 0 refills | Status: DC | PRN

## 2019-11-27 NOTE — ED Notes (Signed)
Left mid back , felt pop while driving over the weekend.  Patient now feels sob with inspiration.  No known injury

## 2019-11-27 NOTE — ED Provider Notes (Addendum)
[place]   MRN: [id number] DOB: May 18, 1965  Subjective:   Ivan [name] is a 54 y.o. male presenting for 2-day history of acute onset left-sided thoracic back pain.  Patient states that he is concerned about his lungs and wants to make sure that he does not have signs of pneumonia or anything heart related.  Denies chest pain, shortness of breath but certain movements do elicit his back pain and taking a deep breath also elicits the pain.  Has not tried medications for relief.  Patient did go on a very long drive of about 812 miles from trip over the weekend.  Denies cough, fevers.  Has had his Covid vaccination.  Denies falls, trauma.  No current facility-administered medications for this encounter.  Current Outpatient Medications:  .  aspirin EC 325 MG EC tablet, Take 1 tablet (325 mg total) by mouth 2 (two) times daily after a meal., Disp: 60 tablet, Rfl: 0 .  bisacodyl (DULCOLAX) 5 MG EC tablet, Take 1 tablet (5 mg total) by mouth daily as needed for moderate constipation., Disp: 15 tablet, Rfl: 0 .  cabergoline (DOSTINEX) 0.5 MG tablet, Take 2 mg by mouth 2 (two) times a week. Take ONLY on Wed / Sat, Disp: , Rfl:  .  docusate sodium (COLACE) 100 MG capsule, Take 1 capsule (100 mg total) by mouth 2 (two) times daily., Disp: 30 capsule, Rfl: 0 .  HYDROcodone-acetaminophen (NORCO) 7.5-325 MG tablet, Take 1-2 tablets by mouth every 4 (four) hours as needed for severe pain (pain score 7-10)., Disp: 40 tablet, Rfl: 0 .  Multiple Vitamin (MULTIVITAMIN) capsule, Take 1 capsule by mouth daily., Disp: , Rfl:  .  ondansetron (ZOFRAN) 4 MG tablet, Take 1 tablet (4 mg total) by mouth every 6 (six) hours as needed for nausea., Disp: 30 tablet, Rfl: 0 .  TESTIM 50 MG/5GM (1%) GEL, USE 2 PACKETS DAILY, Disp: , Rfl: 1 .  traMADol (ULTRAM) 50 MG tablet, Take 1 tablet by mouth every 8 (eight) hours as needed for pain., Disp: , Rfl: 0   No Known Allergies  Past Medical History:  Diagnosis  Date  . Degenerative joint disease (DJD) of hip    Left  . Pituitary tumor 2017   "took RX to shrink it" (11/22/2017)  . Seasonal allergies    "spring; pollen" (11/22/2017)     Past Surgical History:  Procedure Laterality Date  . TOTAL HIP ARTHROPLASTY Left 11/22/2017  . TOTAL HIP ARTHROPLASTY Left 11/22/2017   Procedure: TOTAL HIP ARTHROPLASTY ANTERIOR APPROACH;  Surgeon: Melrose Nakayama, MD;  Location: [facility];  Service: Orthopedics;  Laterality: Left;    Family History  Problem Relation Age of Onset  . Hypertension Mother   . Mental retardation Mother     Social History   Tobacco Use  . Smoking status: Never Smoker  . Smokeless tobacco: Never Used  Vaping Use  . Vaping Use: Never used  Substance Use Topics  . Alcohol use: Yes    Alcohol/week: 3.0 standard drinks    Types: 3 Cans of beer per week  . Drug use: Never    ROS   Objective:   Vitals: BP 129/78 (BP Location: Right Arm)   Pulse 80   Temp 99.1 F (37.3 C) (Oral)   Resp 17   SpO2 95%   Physical Exam Constitutional:      General: He is not in acute distress.    Appearance: Normal appearance. He is well-developed and normal weight. He is  not ill-appearing, toxic-appearing or diaphoretic.  HENT:     Head: Normocephalic and atraumatic.     Right Ear: External ear normal.     Left Ear: External ear normal.     Nose: Nose normal.     Mouth/Throat:     Pharynx: Oropharynx is clear.  Eyes:     General: No scleral icterus.       Right eye: No discharge.        Left eye: No discharge.     Extraocular Movements: Extraocular movements intact.     Pupils: Pupils are equal, round, and reactive to light.  Cardiovascular:     Rate and Rhythm: Normal rate.  Pulmonary:     Effort: Pulmonary effort is normal.  Musculoskeletal:     Cervical back: Normal range of motion.     Thoracic back: Spasms and tenderness (Reproducible over area outlined with associated spasm) present. No swelling, edema, deformity, signs  of trauma, lacerations or bony tenderness. Normal range of motion. No scoliosis.       Back:  Neurological:     Mental Status: He is alert and oriented to person, place, and time.  Psychiatric:        Mood and Affect: Mood normal.        Behavior: Behavior normal.        Thought Content: Thought content normal.        Judgment: Judgment normal.     Assessment and Plan :   PDMP not reviewed this encounter.  1. Acute left-sided thoracic back pain   2. Muscle spasm of back     We will manage conservatively for musculoskeletal type pain given physical exam findings, overall reassuring vital signs and low cardiac risk factors.  Patient has had COVID vaccination, low suspicion for pulmonary acute process.  Offered patient chest x-ray but he declined and I am in agreement.  Counseled patient on potential for adverse effects with medications prescribed/recommended today, ER and return-to-clinic precautions discussed, patient verbalized understanding.    Jaynee Eagles, [place] 11/27/19 1935

## 2019-11-27 NOTE — ED Triage Notes (Signed)
Pt presents with back pain X 2 days.

## 2020-01-02 ENCOUNTER — Other Ambulatory Visit: Payer: Self-pay

## 2020-01-02 ENCOUNTER — Other Ambulatory Visit: Payer: Self-pay | Admitting: Family Medicine

## 2020-01-02 ENCOUNTER — Ambulatory Visit
Admission: RE | Admit: 2020-01-02 | Discharge: 2020-01-02 | Disposition: A | Discharge status: Home / Self Care | Payer: 59 | Source: Ambulatory Visit | Attending: Family Medicine | Admitting: Family Medicine

## 2020-01-02 DIAGNOSIS — R0600 Dyspnea, unspecified: Secondary | ICD-10-CM

## 2020-01-02 DIAGNOSIS — R0609 Other forms of dyspnea: Secondary | ICD-10-CM

## 2020-01-03 ENCOUNTER — Ambulatory Visit: Payer: 59 | Admitting: Cardiology

## 2020-01-03 ENCOUNTER — Emergency Department (HOSPITAL_COMMUNITY): Payer: 59

## 2020-01-03 ENCOUNTER — Encounter (HOSPITAL_COMMUNITY): Payer: Self-pay

## 2020-01-03 ENCOUNTER — Observation Stay (HOSPITAL_COMMUNITY)
Admission: EM | Admit: 2020-01-03 | Discharge: 2020-01-04 | Disposition: A | Discharge status: Home / Self Care | Payer: 59 | Attending: Internal Medicine | Admitting: Internal Medicine

## 2020-01-03 ENCOUNTER — Other Ambulatory Visit: Payer: Self-pay | Admitting: Nurse Practitioner

## 2020-01-03 ENCOUNTER — Encounter: Payer: Self-pay | Admitting: Cardiology

## 2020-01-03 VITALS — BP 166/101 | HR 80 | Ht 71.0 in | Wt 250.8 lb

## 2020-01-03 DIAGNOSIS — I2699 Other pulmonary embolism without acute cor pulmonale: Principal | ICD-10-CM | POA: Diagnosis present

## 2020-01-03 DIAGNOSIS — D75A Glucose-6-phosphate dehydrogenase (G6PD) deficiency without anemia: Secondary | ICD-10-CM | POA: Diagnosis present

## 2020-01-03 DIAGNOSIS — R42 Dizziness and giddiness: Secondary | ICD-10-CM | POA: Diagnosis present

## 2020-01-03 DIAGNOSIS — Z20822 Contact with and (suspected) exposure to covid-19: Secondary | ICD-10-CM | POA: Diagnosis not present

## 2020-01-03 DIAGNOSIS — R030 Elevated blood-pressure reading, without diagnosis of hypertension: Secondary | ICD-10-CM

## 2020-01-03 DIAGNOSIS — R0609 Other forms of dyspnea: Secondary | ICD-10-CM

## 2020-01-03 DIAGNOSIS — D352 Benign neoplasm of pituitary gland: Secondary | ICD-10-CM | POA: Diagnosis present

## 2020-01-03 DIAGNOSIS — N179 Acute kidney failure, unspecified: Secondary | ICD-10-CM | POA: Insufficient documentation

## 2020-01-03 DIAGNOSIS — E291 Testicular hypofunction: Secondary | ICD-10-CM | POA: Diagnosis present

## 2020-01-03 DIAGNOSIS — R55 Syncope and collapse: Secondary | ICD-10-CM | POA: Diagnosis not present

## 2020-01-03 DIAGNOSIS — E66811 Obesity, class 1: Secondary | ICD-10-CM | POA: Diagnosis present

## 2020-01-03 DIAGNOSIS — R0600 Dyspnea, unspecified: Secondary | ICD-10-CM

## 2020-01-03 DIAGNOSIS — R9431 Abnormal electrocardiogram [ECG] [EKG]: Secondary | ICD-10-CM

## 2020-01-03 DIAGNOSIS — E669 Obesity, unspecified: Secondary | ICD-10-CM | POA: Diagnosis present

## 2020-01-03 LAB — CBC
HCT: 49.9 % (ref 39.0–52.0)
Hemoglobin: 15.7 g/dL (ref 13.0–17.0)
MCH: 29.6 pg (ref 26.0–34.0)
MCHC: 31.5 g/dL (ref 30.0–36.0)
MCV: 94.2 fL (ref 80.0–100.0)
Platelets: 199 10*3/uL (ref 150–400)
RBC: 5.3 MIL/uL (ref 4.22–5.81)
RDW: 12.7 % (ref 11.5–15.5)
WBC: 6.2 10*3/uL (ref 4.0–10.5)
nRBC: 0 % (ref 0.0–0.2)

## 2020-01-03 LAB — TROPONIN I (HIGH SENSITIVITY): Troponin I (High Sensitivity): 50 ng/L — ABNORMAL HIGH (ref <18)

## 2020-01-03 LAB — URINALYSIS, ROUTINE W REFLEX MICROSCOPIC
Bacteria, UA: NONE SEEN
Bilirubin Urine: NEGATIVE
Glucose, UA: NEGATIVE mg/dL
Ketones, ur: NEGATIVE mg/dL
Leukocytes,Ua: NEGATIVE
Nitrite: NEGATIVE
Protein, ur: 30 mg/dL — AB
Specific Gravity, Urine: 1.023 (ref 1.005–1.030)
pH: 5 (ref 5.0–8.0)

## 2020-01-03 LAB — BASIC METABOLIC PANEL
Anion gap: 10 (ref 5–15)
BUN: 12 mg/dL (ref 6–20)
CO2: 24 mmol/L (ref 22–32)
Calcium: 9 mg/dL (ref 8.9–10.3)
Chloride: 105 mmol/L (ref 98–111)
Creatinine, Ser: 1.26 mg/dL — ABNORMAL HIGH (ref 0.61–1.24)
GFR calc Af Amer: >60 mL/min (ref >60)
GFR calc non Af Amer: >60 mL/min (ref >60)
Glucose, Bld: 86 mg/dL (ref 70–99)
Potassium: 3.7 mmol/L (ref 3.5–5.1)
Sodium: 139 mmol/L (ref 135–145)

## 2020-01-03 LAB — D-DIMER, QUANTITATIVE: D-Dimer, Quant: 15.5 ug/mL-FEU — ABNORMAL HIGH (ref 0.00–0.50)

## 2020-01-03 MED ORDER — ACETAMINOPHEN 650 MG RE SUPP: 650.0000 mg | Freq: Four times a day (QID) | RECTAL | Status: DC | PRN

## 2020-01-03 MED ORDER — ONDANSETRON HCL 4 MG PO TABS: 4.0000 mg | ORAL_TABLET | Freq: Four times a day (QID) | ORAL | Status: DC | PRN

## 2020-01-03 MED ORDER — METHOCARBAMOL 500 MG PO TABS: 500.0000 mg | ORAL_TABLET | Freq: Three times a day (TID) | ORAL | Status: DC | PRN

## 2020-01-03 MED ORDER — IOHEXOL 350 MG/ML SOLN
90.0000 mL | Freq: Once | INTRAVENOUS | Status: AC | PRN
  Administered 2020-01-03: 90 mL via INTRAVENOUS

## 2020-01-03 MED ORDER — HEPARIN (PORCINE) 25000 UT/250ML-% IV SOLN
1700.0000 [IU]/h | INTRAVENOUS | Status: DC
  Administered 2020-01-04 (×2): 1700 [IU]/h via INTRAVENOUS
  Filled 2020-01-03 (×2): qty 250

## 2020-01-03 MED ORDER — HEPARIN BOLUS VIA INFUSION
6000.0000 [IU] | Freq: Once | INTRAVENOUS | Status: AC
  Administered 2020-01-04: 6000 [IU] via INTRAVENOUS
  Filled 2020-01-03: qty 6000

## 2020-01-03 MED ORDER — TRAMADOL HCL 50 MG PO TABS: 50.0000 mg | ORAL_TABLET | Freq: Four times a day (QID) | ORAL | Status: DC | PRN

## 2020-01-03 MED ORDER — ONDANSETRON HCL 4 MG/2ML IJ SOLN: 4.0000 mg | Freq: Four times a day (QID) | INTRAMUSCULAR | Status: DC | PRN

## 2020-01-03 MED ORDER — CABERGOLINE 0.5 MG PO TABS: 2.0000 mg | ORAL_TABLET | ORAL | Status: DC

## 2020-01-03 MED ORDER — ACETAMINOPHEN 325 MG PO TABS
650.0000 mg | ORAL_TABLET | Freq: Four times a day (QID) | ORAL | Status: DC | PRN
  Filled 2020-01-03: qty 2

## 2020-01-03 NOTE — Progress Notes (Signed)
ANTICOAGULATION CONSULT NOTE - Initial Consult  Pharmacy Consult for heparin  Indication: pulmonary embolus  No Known Allergies  Patient Measurements: Heparin dosing wt: 100kg      Vital Signs: Temp: 98.4 F (36.9 C) (09/09 1748) Temp Source: Oral (09/09 1748) BP: 156/98 (09/09 1748) Pulse Rate: 88 (09/09 1748)  Labs: Recent Labs    01/03/20 1424 01/03/20 2031  HGB 15.7  --   HCT 49.9  --   PLT 199  --   CREATININE 1.26*  --   TROPONINIHS  --  50*    Estimated Creatinine Clearance: 86 mL/min (A) (by C-G formula based on SCr of 1.26 mg/dL (H)).   Medical History: Past Medical History:  Diagnosis Date  . Degenerative joint disease (DJD) of hip    Left  . G6PD deficiency   . Pituitary tumor 2017   "took RX to shrink it" (11/22/2017)  . Seasonal allergies    "spring; pollen" (11/22/2017)      Assessment: 54 yo male with CT showing PE. Pharmacy consulted to dose heparin. No anticoagulants noted PTA.   Goal of Therapy:  Heparin level 0.3-0.7 units/ml Monitor platelets by anticoagulation protocol: Yes   Plan:  -Heparin 6000 units bolus x1 followed by 1700 units/hr -Heparin level in 6 hours and daily wth CBC daily  Hildred Laser, PharmD Clinical Pharmacist **Pharmacist phone directory can now be found on [email or web address] ([email or web address]).  Listed under [place].

## 2020-01-03 NOTE — ED Triage Notes (Signed)
Pt arrives to ED w/ c/o syncope on Monday, pt did hit head at that time, no blood thinners, no neuro deficits. Pt endorses sob w/ exertion, denies chest pain.

## 2020-01-03 NOTE — H&P (Signed)
[facility] History and Physical   Patient: Ivan [name] ZOX:[id number]   PCP: Maude Leriche, PA-C DOB: 10-10-1965   DOA: 01/03/2020   DOS: 01/03/2020   DOS: the patient was seen and examined on 01/03/2020  Patient coming from: The patient is coming from Home  Chief Complaint: Passing out event  HPI: Ivan [name] is a 54 y.o. male with Past medical history of pituitary tumor SP radiotherapy, G6PD deficiency, remote left hip arthroplasty, testosterone deficiency on testosterone therapy. Patient presents with complaints of frequent syncopal events, shortness of breath on exertion as well as right leg pain. 2 weeks ago patient was at his baseline.  At his baseline patient is fairly active, exercising 60+ minutes every day and trying to lose weight. He is started having gradual worsening of his breathing 2 weeks ago with exertion. He denies having any complaints of chest pain or chest tightness.  No fever no chills.  No cough.  No nausea no vomiting no diarrhea.  No bleeding.  No abdominal pain.  No lumps or bumps anywhere.  No change in the medications. 1 week ago simple walking up the hill was causing severe shortness of breath and patient was not able to walk on a treadmill for more than 10 minutes.  1 week ago he also traveled to [place] for a 3-hour drive.  At the same time he also started having pain in his right leg. Last Monday patient had a passing out event at work. Covid test has been negative. No family history of clotting disorder. Patient actually saw cardiology in the clinic and due to concerning EKG changes he was referred to ER for further work-up.  ED Course: CT PE protocol was performed which was positive for large clot burden PE.  Patient was started on heparin and was referred to hospitalist for admission.  At his baseline ambulates without assistance independent for most of his ADL;  manages his medication on his own.  Review of Systems: as mentioned in  the history of present illness.  All other systems reviewed and are negative.  Past Medical History:  Diagnosis Date  . Degenerative joint disease (DJD) of hip    Left  . G6PD deficiency   . Pituitary tumor 2017   "took RX to shrink it" (11/22/2017)  . Seasonal allergies    "spring; pollen" (11/22/2017)   Past Surgical History:  Procedure Laterality Date  . TOTAL HIP ARTHROPLASTY Left 11/22/2017   Procedure: TOTAL HIP ARTHROPLASTY ANTERIOR APPROACH;  Surgeon: Melrose Nakayama, MD;  Location: [facility];  Service: Orthopedics;  Laterality: Left;   Social History:  reports that he has never smoked. He has never used smokeless tobacco. He reports current alcohol use of about 3.0 standard drinks of alcohol per week. He reports that he does not use drugs.  No Known Allergies  Family history reviewed and not pertinent Family History  Problem Relation Age of Onset  . Hypertension Mother   . Mental retardation Mother   . Cancer Maternal Grandmother   . Stroke Maternal Grandfather      Prior to Admission medications   Medication Sig Start Date End Date Taking? Authorizing Provider  cabergoline (DOSTINEX) 0.5 MG tablet Take 2 mg by mouth 2 (two) times a week. Take ONLY on Wed / Sat 12/07/16   [provider]  ibuprofen (ADVIL) 200 MG tablet Take 200 mg by mouth every 6 (six) hours as needed.    [provider]  Multiple Vitamin (MULTIVITAMIN) capsule Take  1 capsule by mouth daily.    [provider]  TESTIM 50 MG/5GM (1%) GEL USE 2 PACKETS DAILY 10/23/17   [provider]    Physical Exam: Vitals:   01/03/20 1406 01/03/20 1748 01/03/20 2155  BP: (!) 159/103 (!) 156/98 (!) 150/95  Pulse: 77 88 86  Resp: 18 14 18   Temp: 98.8 F (37.1 C) 98.4 F (36.9 C) 97.9 F (36.6 C)  TempSrc: Oral Oral Oral  SpO2: 96% 96% 95%    General: alert and oriented to time, place, and person. Appear in mild distress, affect appropriate Eyes: PERRL, Conjunctiva  normal ENT: Oral Mucosa Clear, moist  Neck: no JVD, no Abnormal Mass Or lumps Cardiovascular: S1 and S2 Present, no Murmur, peripheral pulses symmetrical Respiratory: good respiratory effort, Bilateral Air entry equal and Decreased, no signs of accessory muscle use, Clear to Auscultation, no Crackles, no wheezes Abdomen: Bowel Sound present, Soft and no tenderness, no hernia Skin: no rashes right leg warmth without redness Extremities: Bilateral right more than left pedal edema, right calf tenderness Neurologic: without any new focal findings Gait not checked due to patient safety concerns  Data Reviewed: I have personally reviewed and interpreted labs, imaging as discussed below.  CBC: Recent Labs  Lab 01/03/20 1424  WBC 6.2  HGB 15.7  HCT 49.9  MCV 94.2  PLT 161   Basic Metabolic Panel: Recent Labs  Lab 01/03/20 1424  NA 139  K 3.7  CL 105  CO2 24  GLUCOSE 86  BUN 12  CREATININE 1.26*  CALCIUM 9.0   GFR: Estimated Creatinine Clearance: 86 mL/min (A) (by C-G formula based on SCr of 1.26 mg/dL (H)). Liver Function Tests: No results for input(s): AST, ALT, ALKPHOS, BILITOT, PROT, ALBUMIN in the last 168 hours. No results for input(s): LIPASE, AMYLASE in the last 168 hours. No results for input(s): AMMONIA in the last 168 hours. Coagulation Profile: No results for input(s): INR, PROTIME in the last 168 hours. Cardiac Enzymes: No results for input(s): CKTOTAL, CKMB, CKMBINDEX, TROPONINI in the last 168 hours. BNP (last 3 results) No results for input(s): PROBNP in the last 8760 hours. HbA1C: No results for input(s): HGBA1C in the last 72 hours. CBG: No results for input(s): GLUCAP in the last 168 hours. Lipid Profile: No results for input(s): CHOL, HDL, LDLCALC, TRIG, CHOLHDL, LDLDIRECT in the last 72 hours. Thyroid Function Tests: No results for input(s): TSH, T4TOTAL, FREET4, T3FREE, THYROIDAB in the last 72 hours. Anemia Panel: No results for input(s):  VITAMINB12, FOLATE, FERRITIN, TIBC, IRON, RETICCTPCT in the last 72 hours. Urine analysis:    Component Value Date/Time   COLORURINE YELLOW 01/03/2020 2130   APPEARANCEUR HAZY (A) 01/03/2020 2130   LABSPEC 1.023 01/03/2020 2130   PHURINE 5.0 01/03/2020 2130   GLUCOSEU NEGATIVE 01/03/2020 2130   HGBUR SMALL (A) 01/03/2020 2130   BILIRUBINUR NEGATIVE 01/03/2020 2130   [place] NEGATIVE 01/03/2020 2130   PROTEINUR 30 (A) 01/03/2020 2130   NITRITE NEGATIVE 01/03/2020 2130   LEUKOCYTESUR NEGATIVE 01/03/2020 2130    Radiological Exams on Admission: DG Chest 2 View  Result Date: 01/03/2020 CLINICAL DATA:  Dyspnea on exertion. Nonproductive cough for 2 weeks. EXAM: CHEST - 2 VIEW COMPARISON:  11/11/2017 FINDINGS: The heart size and mediastinal contours are within normal limits. Both lungs are clear. The visualized skeletal structures are unremarkable. IMPRESSION: No active cardiopulmonary disease. Electronically Signed   By: Marlaine Hind M.D.   On: 01/03/2020 08:02   CT Angio Chest PE W  and/or Wo Contrast  Result Date: 01/03/2020 CLINICAL DATA:  PE suspected, high prob Shortness of breath and exertional dyspnea.  Elevated D-dimer. EXAM: CT ANGIOGRAPHY CHEST WITH CONTRAST TECHNIQUE: Multidetector CT imaging of the chest was performed using the standard protocol during bolus administration of intravenous contrast. Multiplanar CT image reconstructions and MIPs were obtained to evaluate the vascular anatomy. CONTRAST:  8mL OMNIPAQUE IOHEXOL 350 MG/ML SOLN COMPARISON:  Chest radiograph yesterday. FINDINGS: Cardiovascular: Positive for acute bilateral pulmonary embolus with large bilateral pulmonary emboli. There are filling defects within the distal bilateral main pulmonary arteries extending into all lobar and many segmental branches. Thromboembolic burden is moderate to large. There is no saddle embolus. No evidence of left atrial thrombus. Elevated RV to LV ratio of 1.06. Mild straightening of the  intraventricular septum with minimal contrast refluxing into the hepatic veins and IVC. Borderline cardiomegaly. There is no pericardial effusion. Trace aortic atherosclerosis without dissection. Conventional branching pattern from the aortic arch. Mediastinum/Nodes: No mediastinal or hilar adenopathy. Tiny hiatal hernia. No suspicious thyroid nodule. Lungs/Pleura: Minimal subpleural opacity in the periphery of the right lower lobe may represent small infarct. Additional hypoventilatory changes in the dependent lungs. No confluent airspace disease. No findings of pulmonary edema or pleural effusion. Mild retained mucus in the trachea. Upper Abdomen: Minimal contrast refluxing into the hepatic veins and IVC. Bilateral renal cysts partially included. Musculoskeletal: There are no acute or suspicious osseous abnormalities. Review of the MIP images confirms the above findings. IMPRESSION: 1. Positive for acute bilateral pulmonary embolus with filling defects involving both main pulmonary arteries. Thromboembolic burden is moderate to large. Elevated RV to LV ratio of 1.06. Mild straightening of the intraventricular septum with minimal contrast refluxing into the hepatic veins and IVC, suggesting elevated right heart pressures. 2. Minimal subpleural opacity in the periphery of the right lower lobe may represent small infarct. Aortic Atherosclerosis (ICD10-I70.0). Critical Value/emergent results were called by telephone at the time of interpretation on 01/03/2020 at 9:45 pm to [place] , who verbally acknowledged these results. Electronically Signed   By: Keith Rake M.D.   On: 01/03/2020 21:45   EKG: Independently reviewed. normal sinus rhythm, ST elevation in aVL not meeting STEMI criteria and T wave inversions in multiple leads. Echocardiogram: Ordered  I reviewed all nursing notes, pharmacy notes, vitals, pertinent old records.  Assessment/Plan 1. Acute pulmonary embolus (HCC)  Syncopal  events Possibly provoked event. Moderate to large clot burden. Bilateral mainstem of pulmonary arteries are affected. Presents with recurrent syncopal events with dyspnea on exertion. Currently not hypoxic or tachycardic or hypotensive. Currently no chest pain as well. Patient started on IV heparin.  Echocardiogram ordered.  Lower extremity Doppler ordered.  Monitor results. Patient was informed about DOAC and warfarin as a potential therapy for home. We will also initiate hypercoagulable work-up given new testosterone therapy as a potential etiology which patient has been taking for a while.  2.  Right leg pain Lower extremity Doppler ordered. Possibility of post thrombotic syndrome cannot be ruled out. May require vascular surgery consultation.  3.  Male hypogonadism. Patient is on testosterone therapy.  Which likely is the cause for patient's provoked event or PE although patient has been doing this therapy for a while.  4.  Obesity Patient is trying to lose weight and has lost 10 pounds in last 9 months with exercise.  5.  Prolactinoma. Patient is on cabergoline will continue.  6.  Abnormal EKG. This is likely in the setting of PE.  Seen by cardiology in the clinic. We will check echocardiogram. If abnormal may require input from cardiology as well. Currently on IV heparin therapy. Troponin not significantly elevated, will follow serial troponins.  Nutrition: Cardiac diet DVT Prophylaxis: Therapeutic Anticoagulation with Heparin  Advance goals of care discussion: Full code   Consults: none   Family Communication: family was present at bedside, at the time of interview.  Opportunity was given to ask question and all questions were answered satisfactorily.   Disposition:  Patient is from home.  Likely can go home on discharge.   Author: Berle Mull, MD [facility] 01/03/2020 11:13 PM   To reach On-call, see care teams to locate the attending and reach out to  them via [email or web address]. If 7PM-7AM, please contact night-coverage If you still have difficulty reaching the attending provider, please page the [facility] (Director on Call) for [facility] on amion for assistance.

## 2020-01-03 NOTE — ED Provider Notes (Signed)
[facility] EMERGENCY DEPARTMENT Provider Note   CSN: [id number] Arrival date & time: 01/03/20  1333     History Chief Complaint  Patient presents with  . Loss of Consciousness    Ivan [name] is a 54 y.o. male.  HPI   Patient with significant medical history of G6PD deficiency, pituitary tumor, seasonal allergies presents to the emergency department with chief complaint of dyspnea on exertion.  Patient explains over the last 2 weeks it has increasing gotten worse.  He states he is unable to walk up an incline without becoming short of breath.  He denies denies chest pain, becoming diaphoretic, paresthesias in his upper or lower extremities, radiating pain.  He does admit that his right leg has been hurting for the last week or so.  He states it feels like a cramping like pain which is consistent.  He denies seeing any swelling in his leg.  He does admit to recent travel where he was in the car for about 3 hours.  He uses testosterone daily, has no family history of PEs or DVTs.  He was recently seen in his cardiologist today Dr. Terri Skains who was concerned patient may have a PE versus coronary artery disease.  He sent him via EMS here for further evaluation and has him tentatively scheduled for Cath Lab tomorrow if PE study is negative.  Patient denies headache, fever, chills, chest pain, abdominal pain, nausea, vomiting, diarrhea, pedal edema.  Past Medical History:  Diagnosis Date  . Degenerative joint disease (DJD) of hip    Left  . G6PD deficiency   . Pituitary tumor 2017   "took RX to shrink it" (11/22/2017)  . Seasonal allergies    "spring; pollen" (11/22/2017)    Patient Active Problem List   Diagnosis Date Noted  . Acute pulmonary embolus ([place]) 01/03/2020  . G6PD deficiency   . Primary localized osteoarthritis of left hip 11/22/2017  . Primary osteoarthritis of left hip 11/22/2017  . Obesity, Class I, BMI 30.0-34.9 (see actual BMI) 06/08/2016  . Male  hypogonadism 08/16/2013  . Prolactinoma ([place]) 07/11/2013  . ABNORMAL EKG 05/08/2009    Past Surgical History:  Procedure Laterality Date  . TOTAL HIP ARTHROPLASTY Left 11/22/2017   Procedure: TOTAL HIP ARTHROPLASTY ANTERIOR APPROACH;  Surgeon: Melrose Nakayama, MD;  Location: [facility];  Service: Orthopedics;  Laterality: Left;       Family History  Problem Relation Age of Onset  . Hypertension Mother   . Mental retardation Mother   . Cancer Maternal Grandmother   . Stroke Maternal Grandfather     Social History   Tobacco Use  . Smoking status: Never Smoker  . Smokeless tobacco: Never Used  Vaping Use  . Vaping Use: Never used  Substance Use Topics  . Alcohol use: Yes    Alcohol/week: 3.0 standard drinks    Types: 3 Cans of beer per week  . Drug use: Never    Home Medications Prior to Admission medications   Medication Sig Start Date End Date Taking? Authorizing Provider  cabergoline (DOSTINEX) 0.5 MG tablet Take 2 mg by mouth 2 (two) times a week. Take ONLY on Wed / Sat 12/07/16   [provider]  ibuprofen (ADVIL) 200 MG tablet Take 200 mg by mouth every 6 (six) hours as needed.    [provider]  Multiple Vitamin (MULTIVITAMIN) capsule Take 1 capsule by mouth daily.    [provider]  TESTIM 50 MG/5GM (1%) GEL USE 2  PACKETS DAILY 10/23/17   [provider]    Allergies    Patient has no known allergies.  Review of Systems   Review of Systems  Constitutional: Negative for chills and fever.  HENT: Negative for congestion, tinnitus, trouble swallowing and voice change.   Eyes: Negative for visual disturbance.  Respiratory: Positive for shortness of breath. Negative for cough.   Cardiovascular: Negative for chest pain and palpitations.  Gastrointestinal: Negative for abdominal pain, diarrhea, nausea and vomiting.  Genitourinary: Negative for enuresis, flank pain and hematuria.  Musculoskeletal: Negative for back pain.       Admits  to right leg pain.  Skin: Negative for rash.  Neurological: Negative for dizziness, light-headedness and headaches.  Hematological: Does not bruise/bleed easily.    Physical Exam Updated Vital Signs BP (!) 150/95 (BP Location: Left Arm)   Pulse 86   Temp 97.9 F (36.6 C) (Oral)   Resp 18   SpO2 95%   Physical Exam Vitals and nursing note reviewed.  Constitutional:      General: He is not in acute distress.    Appearance: He is not ill-appearing.  HENT:     Head: Normocephalic and atraumatic.     Nose: No congestion.     Mouth/Throat:     Mouth: Mucous membranes are moist.     Pharynx: Oropharynx is clear.  Eyes:     General: No scleral icterus. Cardiovascular:     Rate and Rhythm: Normal rate and regular rhythm.     Pulses: Normal pulses.     Heart sounds: No murmur heard.  No friction rub. No gallop.   Pulmonary:     Effort: No respiratory distress.     Breath sounds: No wheezing, rhonchi or rales.     Comments: Patient does not appear to be in any acute respiratory distress or failure.  He is breathing on room air without any difficulty.  Lung sounds are clear bilaterally no rales, rhonchi's, stridor or wheezing heard. Abdominal:     General: There is no distension.     Palpations: Abdomen is soft.     Tenderness: There is no abdominal tenderness. There is no right CVA tenderness, left CVA tenderness or guarding.  Musculoskeletal:        General: No swelling.     Right lower leg: No edema.     Left lower leg: No edema.  Skin:    General: Skin is warm and dry.     Capillary Refill: Capillary refill takes less than 2 seconds.     Findings: No rash.  Neurological:     General: No focal deficit present.     Mental Status: He is alert.  Psychiatric:        Mood and Affect: Mood normal.     ED Results / Procedures / Treatments   Labs (all labs ordered are listed, but only abnormal results are displayed) Labs Reviewed  BASIC METABOLIC PANEL - Abnormal; Notable  for the following components:      Result Value   Creatinine, Ser 1.26 (*)    All other components within normal limits  URINALYSIS, ROUTINE W REFLEX MICROSCOPIC - Abnormal; Notable for the following components:   APPearance HAZY (*)    Hgb urine dipstick SMALL (*)    Protein, ur 30 (*)    All other components within normal limits  D-DIMER, QUANTITATIVE (NOT AT [facility]) - Abnormal; Notable for the following components:   D-Dimer, Quant 15.50 (*)  All other components within normal limits  TROPONIN I (HIGH SENSITIVITY) - Abnormal; Notable for the following components:   Troponin I (High Sensitivity) 50 (*)    All other components within normal limits  SARS CORONAVIRUS 2 BY RT PCR (HOSPITAL ORDER, [facility])  CBC  HEPARIN LEVEL (UNFRACTIONATED)  CBC  HIV ANTIBODY (ROUTINE TESTING W REFLEX)  COMPREHENSIVE METABOLIC PANEL  ANTITHROMBIN III  PROTEIN C ACTIVITY  PROTEIN C, TOTAL  PROTEIN S ACTIVITY  PROTEIN S, TOTAL  LUPUS ANTICOAGULANT PANEL  BETA-2-GLYCOPROTEIN I ABS, IGG/M/A  HOMOCYSTEINE  FACTOR 5 LEIDEN  PROTHROMBIN GENE MUTATION  CARDIOLIPIN ANTIBODIES, IGG, IGM, IGA  TROPONIN I (HIGH SENSITIVITY)    EKG EKG Interpretation  Date/Time:  Thursday January 03 2020 13:38:47 EDT Ventricular Rate:  87 PR Interval:  160 QRS Duration: 82 QT Interval:  348 QTC Calculation: 418 R Axis:   -64 Text Interpretation: Normal sinus rhythm Left anterior fascicular block T wave abnormality No significant change since last tracing Abnormal ECG Confirmed by Carmin Muskrat [phone]) on 01/03/2020 8:32:22 PM   Radiology DG Chest 2 View  Result Date: 01/03/2020 CLINICAL DATA:  Dyspnea on exertion. Nonproductive cough for 2 weeks. EXAM: CHEST - 2 VIEW COMPARISON:  11/11/2017 FINDINGS: The heart size and mediastinal contours are within normal limits. Both lungs are clear. The visualized skeletal structures are unremarkable. IMPRESSION: No active cardiopulmonary  disease. Electronically Signed   By: Marlaine Hind M.D.   On: 01/03/2020 08:02   CT Angio Chest PE W and/or Wo Contrast  Result Date: 01/03/2020 CLINICAL DATA:  PE suspected, high prob Shortness of breath and exertional dyspnea.  Elevated D-dimer. EXAM: CT ANGIOGRAPHY CHEST WITH CONTRAST TECHNIQUE: Multidetector CT imaging of the chest was performed using the standard protocol during bolus administration of intravenous contrast. Multiplanar CT image reconstructions and MIPs were obtained to evaluate the vascular anatomy. CONTRAST:  37mL OMNIPAQUE IOHEXOL 350 MG/ML SOLN COMPARISON:  Chest radiograph yesterday. FINDINGS: Cardiovascular: Positive for acute bilateral pulmonary embolus with large bilateral pulmonary emboli. There are filling defects within the distal bilateral main pulmonary arteries extending into all lobar and many segmental branches. Thromboembolic burden is moderate to large. There is no saddle embolus. No evidence of left atrial thrombus. Elevated RV to LV ratio of 1.06. Mild straightening of the intraventricular septum with minimal contrast refluxing into the hepatic veins and IVC. Borderline cardiomegaly. There is no pericardial effusion. Trace aortic atherosclerosis without dissection. Conventional branching pattern from the aortic arch. Mediastinum/Nodes: No mediastinal or hilar adenopathy. Tiny hiatal hernia. No suspicious thyroid nodule. Lungs/Pleura: Minimal subpleural opacity in the periphery of the right lower lobe may represent small infarct. Additional hypoventilatory changes in the dependent lungs. No confluent airspace disease. No findings of pulmonary edema or pleural effusion. Mild retained mucus in the trachea. Upper Abdomen: Minimal contrast refluxing into the hepatic veins and IVC. Bilateral renal cysts partially included. Musculoskeletal: There are no acute or suspicious osseous abnormalities. Review of the MIP images confirms the above findings. IMPRESSION: 1. Positive for  acute bilateral pulmonary embolus with filling defects involving both main pulmonary arteries. Thromboembolic burden is moderate to large. Elevated RV to LV ratio of 1.06. Mild straightening of the intraventricular septum with minimal contrast refluxing into the hepatic veins and IVC, suggesting elevated right heart pressures. 2. Minimal subpleural opacity in the periphery of the right lower lobe may represent small infarct. Aortic Atherosclerosis (ICD10-I70.0). Critical Value/emergent results were called by telephone at the time of interpretation on 01/03/2020  at 9:45 pm to PA Deno Etienne , who verbally acknowledged these results. Electronically Signed   By: Keith Rake M.D.   On: 01/03/2020 21:45    Procedures .Critical Care Performed by: Marcello Fennel, PA-C Authorized by: Marcello Fennel, PA-C   Critical care provider statement:    Critical care time (minutes):  45   Critical care was necessary to treat or prevent imminent or life-threatening deterioration of the following conditions: Pulmonary embolism starting on heparin.   Critical care was time spent personally by me on the following activities:  Discussions with consultants, evaluation of patient's response to treatment, examination of patient, ordering and performing treatments and interventions, ordering and review of laboratory studies, ordering and review of radiographic studies, pulse oximetry, re-evaluation of patient's condition, obtaining history from patient or surrogate and review of old charts   (including critical care time)  Medications Ordered in ED Medications  heparin bolus via infusion 6,000 Units (has no administration in time range)  heparin ADULT infusion 100 units/mL (25000 units/22mL sodium chloride 0.45%) (has no administration in time range)  cabergoline (DOSTINEX) tablet 2 mg (has no administration in time range)  acetaminophen (TYLENOL) tablet 650 mg (has no administration in time range)    Or   acetaminophen (TYLENOL) suppository 650 mg (has no administration in time range)  ondansetron (ZOFRAN) tablet 4 mg (has no administration in time range)    Or  ondansetron (ZOFRAN) injection 4 mg (has no administration in time range)  traMADol (ULTRAM) tablet 50 mg (has no administration in time range)  methocarbamol (ROBAXIN) tablet 500 mg (has no administration in time range)  iohexol (OMNIPAQUE) 350 MG/ML injection 90 mL (90 mLs Intravenous Contrast Given 01/03/20 2133)    ED Course  I have reviewed the triage vital signs and the nursing notes.  Pertinent labs & imaging results that were available during my care of the patient were reviewed by me and considered in my medical decision making (see chart for details).    MDM Rules/Calculators/A&P                          I have personally reviewed all imaging, labs and have interpreted them.  Patient presents with dyspnea on exertion.  He was alert and oriented did not appear in acute distress, vital signs reassuring.  On exam no signs of respiratory distress or failure noted, lung sounds were clear bilaterally, abdomen was soft nontender to palpation, no pedal edema noted.  Due to dyspnea on exertion with recent travel as well as hormone use concern for PE will send for CT angiogram, obtain D-dimer, and screening labs for further evaluation.  CBC does not show leukocytosis or anemia, BMP shows no electrolyte abnormalities, slight AKI 1.26.  D-dimer elevated at 15.5, EKG shows sinus rhythm with nonspecific T wave inversions no signs of ischemia or ST elevation or depression.  CT angiogram positive for bilateral pulmonary embolism with right heart strain.  Will start patient on heparin and consult hospitalist for further recommendation.  Spoke with Dr. Posey Pronto of the hospitalist team he agrees patient needs to be admitted for further evaluation.  He will come and assess the patient.  I have low suspicions patient suffering from ACS as he denies  chest pain, no signs of ischemia on EKG, troponin slightly elevated at 50.  I suspect the slight elevation in troponin is secondary to his PE with heart strain.  Low suspicion for abdominal abnormality requiring  surgical intervention as he denies abdominal pain, tolerating p.o. without difficulty, abdomen is nontender to palpation.  Low suspicion for systemic infection as patient is nontoxic-appearing, vital signs reassuring, no obvious source of infection noted on exam.  I suspect patient's symptoms are secondary to his pulmonary embolism.  He will need anticoags and further DVT work-up.  Patient was reassessed, he is resting comfortably, vital signs reassuring.  Patient care will be transferred over to hospitalist team for further management. Final Clinical Impression(s) / ED Diagnoses Final diagnoses:  Acute pulmonary embolism, unspecified pulmonary embolism type, unspecified whether acute cor pulmonale present ([place])  AKI (acute kidney injury) [facility])    Rx / DC Orders ED Discharge Orders    None       Marcello Fennel, PA-C 01/03/20 2308    Carmin Muskrat, MD 01/05/20 1317

## 2020-01-03 NOTE — Progress Notes (Signed)
Patient seen by my partner Dr. Terri Skains earlier in the office today. Complains of chest pain and shortness of breath with EKG changes concern for ischemia. Differentials include from the embolism versus unstable angina. Consider CTA to evaluate for PE. If CTA negative, will consider doing cardiac catheterization tomorrow (tentatively scheduled) If CTA positive for PE, recommend medical admission.  Vernell Leep, MD Pager [phone]

## 2020-01-03 NOTE — Progress Notes (Signed)
Date:  01/03/2020   ID:  Ivan [name], DOB [birth year], MRN [id number]  PCP:  Maude Leriche, PA-C  Cardiologist:  Rex Kras, DO, [facility] (established care 01/03/2020) Former Cardiology Providers: Dr. Haroldine Laws   REASON FOR CONSULT: Shortness of Breath and Syncope.   REQUESTING PHYSICIAN:  Scifres, Dorothy, PA-C [address] [place],  [place] [address]  Chief Complaint  Patient presents with  . Shortness of Breath  . Abnormal ECG  . New Patient (Initial Visit)    HPI  Ivan [name] is a 54 y.o. male who presents to the office with a chief complaint of " shortness of breath and syncope."  No significant past cardiac history.  Patient has history is G6PD deficiency  He is referred to the office at the request of Scifres, Earlie Server, PA-C for evaluation of dyspnea on exertion and syncope.  Patient is accompanied by his wife [name] who used to work as a Equities trader.  Patient states that he has been working out for the last 9 months by walking at least 90 minutes in the morning and serving in the evening hours until the last 2 weeks he has been experiencing effort related dyspnea that has been progressively worse.  Patient is overall endurance has reduced significantly to the point that he gets effort related dyspnea to the point he has to stop after working out for 10 minutes.   No known cardiac history.  Patient does have diffuse T wave inversions in the inferolateral leads that are old.  Patient had a stress test back in 2011 findings noted below.  Last week Wednesday/Thursday he had drove to [place] 3 hours each way.  And has noted more worsening of shortness of breath.  He also has leg pain localized to the right knee on the posterior aspect which is new compared to the past.  Patient states that on Monday Labor Day on September 6 he went to work and while he was physically active he was not feeling well.  He went up 6 or 7 steps and the shortness of breath  progressively got worse he knew he was going to pass out and sat on a chair.  Patient tipped over and fell on the floor hitting the left side of his face.  This happened at work nobody was available to witness this event except the security cameras.  Patient woke up approximately a minute to 1.5 minutes later.  Prior to the syncopal event patient states that he felt diaphoretic.  When he woke up he had a small hematoma on the left side of the face according to his wife.  He did not lose any bowel bladder function.  He now presents to for consultation for effort related dyspnea and syncope.  Since his episode patient has been tested negative for COVID-19.  He had some blood work done for his endocrinology appointment yesterday no labs available for review.  Patient's blood pressures are elevated at today's office visit but the patient's wife states that they are pretty well controlled at home.  He usually has blood pressures in the range of 130/79 mmHg.   No family history of premature coronary disease or sudden cardiac death.  Denies prior history of coronary artery disease, myocardial infarction, congestive heart failure, deep venous thrombosis, pulmonary embolism, stroke, transient ischemic attack.  FUNCTIONAL STATUS: Prior to the last week he would walk 1hr 83min 4-5 days a week and swim in the evening.   ALLERGIES: No Known Allergies  MEDICATION LIST PRIOR TO VISIT: Current Meds  Medication Sig  . cabergoline (DOSTINEX) 0.5 MG tablet Take 2 mg by mouth 2 (two) times a week. Take ONLY on Wed / Sat  . ibuprofen (ADVIL) 200 MG tablet Take 200 mg by mouth every 6 (six) hours as needed.  . Multiple Vitamin (MULTIVITAMIN) capsule Take 1 capsule by mouth daily.  . TESTIM 50 MG/5GM (1%) GEL USE 2 PACKETS DAILY  . tiZANidine (ZANAFLEX) 4 MG tablet Take 1 tablet (4 mg total) by mouth every 8 (eight) hours as needed for muscle spasms.  . [DISCONTINUED] Semaglutide-Weight Management 2.4 MG/0.75ML  SOAJ as directed.     PAST MEDICAL HISTORY: Past Medical History:  Diagnosis Date  . Degenerative joint disease (DJD) of hip    Left  . G6PD deficiency   . Pituitary tumor 2017   "took RX to shrink it" (11/22/2017)  . Seasonal allergies    "spring; pollen" (11/22/2017)    PAST SURGICAL HISTORY: Past Surgical History:  Procedure Laterality Date  . TOTAL HIP ARTHROPLASTY Left 11/22/2017   Procedure: TOTAL HIP ARTHROPLASTY ANTERIOR APPROACH;  Surgeon: Melrose Nakayama, MD;  Location: [facility];  Service: Orthopedics;  Laterality: Left;    FAMILY HISTORY: The patient family history includes Cancer in his maternal grandmother; Hypertension in his mother; Mental retardation in his mother; Stroke in his maternal grandfather.  SOCIAL HISTORY:  The patient  reports that he has never smoked. He has never used smokeless tobacco. He reports current alcohol use of about 3.0 standard drinks of alcohol per week. He reports that he does not use drugs.  REVIEW OF SYSTEMS: Review of Systems  Constitutional: Negative for chills and fever.  HENT: Negative for hoarse voice and nosebleeds.   Eyes: Negative for discharge, double vision and pain.  Cardiovascular: Positive for dyspnea on exertion. Negative for chest pain, claudication, leg swelling, near-syncope, orthopnea, palpitations, paroxysmal nocturnal dyspnea and syncope.  Respiratory: Positive for shortness of breath. Negative for hemoptysis.   Musculoskeletal: Negative for muscle cramps and myalgias.  Gastrointestinal: Negative for abdominal pain, constipation, diarrhea, hematemesis, hematochezia, melena, nausea and vomiting.  Neurological: Negative for dizziness and light-headedness.    PHYSICAL EXAM: Vitals with BMI 01/03/2020 11/27/2019 11/24/2017  Height 5\' 11"  - -  Weight 250 lbs 13 oz - -  BMI 17.00 - -  Systolic 174 944 967  Diastolic 591 78 94  Pulse 80 80 105   Orthostatic VS for the past 72 hrs (Last 3 readings):  Orthostatic BP Patient  Position BP Location Cuff Size Orthostatic Pulse  01/03/20 1235 (!) 153/101 Standing Left Arm Large 93  01/03/20 1233 (!) 158/102 Sitting Left Arm Large 80  01/03/20 1232 (!) 162/103 Supine Left Arm Large 95   CONSTITUTIONAL: Well-developed and well-nourished. No acute distress.  SKIN: Skin is warm and dry. No rash noted. No cyanosis. No pallor. No jaundice HEAD: Normocephalic and atraumatic.  EYES: No scleral icterus MOUTH/THROAT: Moist oral membranes.  NECK: No JVD present. No thyromegaly noted. No carotid bruits  LYMPHATIC: No visible cervical adenopathy.  CHEST Normal respiratory effort. No intercostal retractions  LUNGS: Clear to auscultation bilaterally.  No stridor. No wheezes. No rales.  CARDIOVASCULAR: Regular, positive M3-W4, soft holosystolic murmur heard at the apex, no gallops or rubs. ABDOMINAL: No apparent ascites.  EXTREMITIES: No peripheral edema  HEMATOLOGIC: No significant bruising NEUROLOGIC: Oriented to person, place, and time. Nonfocal. Normal muscle tone.  PSYCHIATRIC: Normal mood and affect. Normal behavior. Cooperative  CARDIAC DATABASE: EKG: 01/03/2020: Normal  sinus rhythm, 71 bpm, left axis deviation, left anterior fascicular block, diffuse T wave inversions in the inferolateral leads suggestive of ischemia.  Similar findings on prior EKG dated 11/11/2017.  Echocardiogram: None    Stress Testing: 05/12/2009 Myoview stress test:  Exercise Capacity: Fair exercise capacity. BP Response: Normal blood pressure response. Clinical Symptoms: Fatigue and thigh tightness, no chest pain.  ECG Impression: Insignificant upsloping ST segment depression. Overall Impression: Fixed mild mid anteroseptal defect in the setting of normal wall motion may represent attenuation.  Cannot fullly rule out prior infarction, however.  No ischemia.  Heart Catheterization: None  LABORATORY DATA: No recent labs for review.   IMPRESSION:    ICD-10-CM   1. Dyspnea on exertion   R06.00 EKG 12-Lead  2. G6PD deficiency  D75.A   3. Syncope and collapse  R55   4. Nonspecific abnormal electrocardiogram (ECG) (EKG)  R94.31 EKG 12-Lead     RECOMMENDATIONS: WRIGLEY PLASENCIA is a 54 y.o. male No significant past cardiac history.  Patient has history is G6PD deficiency  Dyspnea on exertion:  Patient has been experiencing dyspnea on exertion for the last 2 weeks but more progressive over the last several days.  His overall physical endurance has significantly reduced the point that he has effort related dyspnea within 10 minutes of exercise.  This is in comparison to walking 1 hour and 15 minutes in the morning and swimming in the evenings at baseline.  EKG shows normal sinus rhythm with diffuse T wave inversions in the inferolateral leads a little bit more pronounced compared to prior EKGs.  Cannot entirely rule out ischemic etiology.  He also drove to [place] driving 3 hours at a stretch to and 3 hours back and since then his shortness of breath has gotten progressively worse and on Monday, 12/31/2019 had a syncopal event at work.  Cannot rule out underlying DVT/PE  Orthostatic vital signs negative at today's office visit.  Prolonged discussion with both the patient and his wife [name] that the symptoms of effort related dyspnea may be secondary to underlying coronary artery disease versus PE.  Both conditions can be life-threatening and therefore a more urgent evaluation is recommended.  Recommended that patient be transferred to ER for further evaluation and management.  They refused EMS transfer and his wife will take him to the ER.  Syncope and collapse:  Etiology unknown at this time.  Orthostatic vital signs negative.  Syncopal event in the setting of progressive dyspnea on exertion is suspicious for underlying pulmonary embolism.  Patient is recommended to go to the ER for further evaluation on more urgent basis.  Patient will need mobile cardiac ambulatory  telemetry as outpatient to rule out underlying dysrhythmias.  We will discuss this further at the next office visit.  Abnormal EKG: See above  Elevated blood pressures without the diagnosis of hypertension: Patient's office blood pressures are elevated.  Patient's wife who is a nurse states that his home blood pressures are usually around 130/79 mmHg.  I have asked him to keep a log of his blood pressures on an outpatient basis and to review with his PCP to see if pharmacological therapy is warranted.  Low-salt diet recommended as well.  We will monitor peripherally.  Would like to follow the patient closely and will see him within 4 weeks.  FINAL MEDICATION LIST END OF ENCOUNTER: No orders of the defined types were placed in this encounter.   Current Outpatient Medications:  .  cabergoline (DOSTINEX) 0.5 MG  tablet, Take 2 mg by mouth 2 (two) times a week. Take ONLY on Wed / Sat, Disp: , Rfl:  .  ibuprofen (ADVIL) 200 MG tablet, Take 200 mg by mouth every 6 (six) hours as needed., Disp: , Rfl:  .  Multiple Vitamin (MULTIVITAMIN) capsule, Take 1 capsule by mouth daily., Disp: , Rfl:  .  TESTIM 50 MG/5GM (1%) GEL, USE 2 PACKETS DAILY, Disp: , Rfl: 1 .  tiZANidine (ZANAFLEX) 4 MG tablet, Take 1 tablet (4 mg total) by mouth every 8 (eight) hours as needed for muscle spasms., Disp: 30 tablet, Rfl: 0  Orders Placed This Encounter  Procedures  . EKG 12-Lead    There are no Patient Instructions on file for this visit.   --Continue cardiac medications as reconciled in final medication list. --Return in about 4 weeks (around 01/31/2020) for Reevaluation of, Dyspnea, Review test results. Or sooner if needed. --Continue follow-up with your primary care physician regarding the management of your other chronic comorbid conditions.  Patient's questions and concerns were addressed to his satisfaction. He voices understanding of the instructions provided during this encounter.   This note was created  using a voice recognition software as a result there may be grammatical errors inadvertently enclosed that do not reflect the nature of this encounter. Every attempt is made to correct such errors.  Rex Kras, [place], [facility]  Pager: [phone] Office: [phone]

## 2020-01-03 NOTE — ED Notes (Signed)
Denies syncope, headache or dizziness. Ate chips about 1700. Reinforced NPO.

## 2020-01-03 NOTE — ED Notes (Signed)
Pt assigned to hallway bed, no answer to be taken back.

## 2020-01-04 ENCOUNTER — Observation Stay (HOSPITAL_BASED_OUTPATIENT_CLINIC_OR_DEPARTMENT_OTHER): Payer: 59

## 2020-01-04 DIAGNOSIS — M7989 Other specified soft tissue disorders: Secondary | ICD-10-CM

## 2020-01-04 DIAGNOSIS — I5032 Chronic diastolic (congestive) heart failure: Secondary | ICD-10-CM | POA: Diagnosis not present

## 2020-01-04 DIAGNOSIS — I2699 Other pulmonary embolism without acute cor pulmonale: Secondary | ICD-10-CM

## 2020-01-04 LAB — CBC
HCT: 45.4 % (ref 39.0–52.0)
Hemoglobin: 14.8 g/dL (ref 13.0–17.0)
MCH: 29.4 pg (ref 26.0–34.0)
MCHC: 32.6 g/dL (ref 30.0–36.0)
MCV: 90.3 fL (ref 80.0–100.0)
Platelets: 199 10*3/uL (ref 150–400)
RBC: 5.03 MIL/uL (ref 4.22–5.81)
RDW: 12.6 % (ref 11.5–15.5)
WBC: 6.8 10*3/uL (ref 4.0–10.5)
nRBC: 0 % (ref 0.0–0.2)

## 2020-01-04 LAB — HIV ANTIBODY (ROUTINE TESTING W REFLEX): HIV Screen 4th Generation wRfx: NONREACTIVE

## 2020-01-04 LAB — HEPARIN LEVEL (UNFRACTIONATED)
Heparin Unfractionated: 0.54 IU/mL (ref 0.30–0.70)
Heparin Unfractionated: 0.41 IU/mL (ref 0.30–0.70)

## 2020-01-04 LAB — COMPREHENSIVE METABOLIC PANEL
ALT: 33 U/L (ref 0–44)
AST: 30 U/L (ref 15–41)
Albumin: 3.4 g/dL — ABNORMAL LOW (ref 3.5–5.0)
Alkaline Phosphatase: 53 U/L (ref 38–126)
Anion gap: 9 (ref 5–15)
BUN: 11 mg/dL (ref 6–20)
CO2: 24 mmol/L (ref 22–32)
Calcium: 9 mg/dL (ref 8.9–10.3)
Chloride: 107 mmol/L (ref 98–111)
Creatinine, Ser: 1.11 mg/dL (ref 0.61–1.24)
GFR calc Af Amer: >60 mL/min (ref >60)
GFR calc non Af Amer: >60 mL/min (ref >60)
Glucose, Bld: 101 mg/dL — ABNORMAL HIGH (ref 70–99)
Potassium: 3.9 mmol/L (ref 3.5–5.1)
Sodium: 140 mmol/L (ref 135–145)
Total Bilirubin: 1.2 mg/dL (ref 0.3–1.2)
Total Protein: 6.7 g/dL (ref 6.5–8.1)

## 2020-01-04 LAB — ECHOCARDIOGRAM COMPLETE
Area-P 1/2: 1.75 cm2
Height: 71 in
S' Lateral: 2.2 cm
Weight: 3968.28 oz

## 2020-01-04 LAB — SARS CORONAVIRUS 2 BY RT PCR (HOSPITAL ORDER, PERFORMED IN ~~LOC~~ HOSPITAL LAB): SARS Coronavirus 2: NEGATIVE

## 2020-01-04 LAB — ANTITHROMBIN III: AntiThromb III Func: 89 % (ref 75–120)

## 2020-01-04 LAB — TROPONIN I (HIGH SENSITIVITY): Troponin I (High Sensitivity): 44 ng/L — ABNORMAL HIGH (ref <18)

## 2020-01-04 SURGERY — LEFT HEART CATH AND CORONARY ANGIOGRAPHY
Anesthesia: LOCAL

## 2020-01-04 MED ORDER — CABERGOLINE 0.5 MG PO TABS: 1.0000 mg | ORAL_TABLET | ORAL | 0 refills | Status: AC

## 2020-01-04 MED ORDER — RIVAROXABAN (XARELTO) VTE STARTER PACK (15 & 20 MG): ORAL_TABLET | ORAL | 0 refills | Status: DC

## 2020-01-04 MED ORDER — APIXABAN (ELIQUIS) VTE STARTER PACK (10MG AND 5MG): ORAL_TABLET | ORAL | 0 refills | Status: DC

## 2020-01-04 MED ORDER — RIVAROXABAN 20 MG PO TABS: 20.0000 mg | ORAL_TABLET | Freq: Every day | ORAL | Status: DC

## 2020-01-04 MED ORDER — RIVAROXABAN 15 MG PO TABS
15.0000 mg | ORAL_TABLET | Freq: Two times a day (BID) | ORAL | Status: DC
  Administered 2020-01-04: 15 mg via ORAL
  Filled 2020-01-04: qty 1

## 2020-01-04 MED FILL — XARELTO STARTER PACK: 15 & 20 | 30 days supply | Qty: 51 | Fill #0

## 2020-01-04 NOTE — Discharge Instructions (Signed)

## 2020-01-04 NOTE — Progress Notes (Signed)
ANTICOAGULATION CONSULT NOTE - Initial Consult  Pharmacy Consult for heparin  Indication: pulmonary embolus  No Known Allergies  Patient Measurements: Heparin dosing wt: 100kg  HEPARIN DW (KG): 99.6   Vital Signs: Temp: 99.1 F (37.3 C) (09/10 0558) Temp Source: Oral (09/10 0558) BP: 142/98 (09/10 0558) Pulse Rate: 83 (09/10 0558)  Labs: Recent Labs    01/03/20 1424 01/03/20 2031 01/04/20 0018 01/04/20 0602  HGB 15.7  --   --  14.8  HCT 49.9  --   --  45.4  PLT 199  --   --  199  HEPARINUNFRC  --   --   --  0.54  CREATININE 1.26*  --   --  1.11  TROPONINIHS  --  50* 44*  --     Estimated Creatinine Clearance: 97.1 mL/min (by C-G formula based on SCr of 1.11 mg/dL).   Medical History: Past Medical History:  Diagnosis Date  . Degenerative joint disease (DJD) of hip    Left  . G6PD deficiency   . Pituitary tumor 2017   "took RX to shrink it" (11/22/2017)  . Seasonal allergies    "spring; pollen" (11/22/2017)      Assessment: 54 yo male with CT showing PE. Pharmacy consulted to dose heparin. No anticoagulants noted PTA. -Initial heparin level at goal, CBC stable  Goal of Therapy:  Heparin level 0.3-0.7 units/ml Monitor platelets by anticoagulation protocol: Yes   Plan:  -Continue heparin at 1700 units/hr -confirm heparin level later today -Heparin daily wth CBC daily  Hildred Laser, PharmD Clinical Pharmacist **Pharmacist phone directory can now be found on [email or web address] ([email or web address]).  Listed under [place].

## 2020-01-04 NOTE — Care Management (Signed)
Sent Xarelto 30 day free card and $10.00 co pay card to nurse to give to patient.   Magdalen Spatz RN

## 2020-01-04 NOTE — Progress Notes (Signed)
ANTICOAGULATION CONSULT NOTE  Pharmacy Consult for heparin  Indication: pulmonary embolus  No Known Allergies  Patient Measurements: Heparin dosing wt: 100kg  HEPARIN DW (KG): 99.6   Vital Signs: Temp: 98.4 F (36.9 C) (09/10 1253) Temp Source: Oral (09/10 1253) BP: 142/95 (09/10 1253) Pulse Rate: 86 (09/10 1253)  Labs: Recent Labs    01/03/20 1424 01/03/20 2031 01/04/20 0018 01/04/20 0602 01/04/20 1311  HGB 15.7  --   --  14.8  --   HCT 49.9  --   --  45.4  --   PLT 199  --   --  199  --   HEPARINUNFRC  --   --   --  0.54 0.41  CREATININE 1.26*  --   --  1.11  --   TROPONINIHS  --  50* 44*  --   --     Estimated Creatinine Clearance: 97.1 mL/min (by C-G formula based on SCr of 1.11 mg/dL).   Medical History: Past Medical History:  Diagnosis Date  . Degenerative joint disease (DJD) of hip    Left  . G6PD deficiency   . Pituitary tumor 2017   "took RX to shrink it" (11/22/2017)  . Seasonal allergies    "spring; pollen" (11/22/2017)      Assessment: 54 yo male with CT showing PE. Pharmacy consulted to dose heparin. No anticoagulants noted PTA.  Heparin level remains therapeutic on 1700 units/hr, doppler today showing DVT also.  Goal of Therapy:  Heparin level 0.3-0.7 units/ml Monitor platelets by anticoagulation protocol: Yes   Plan:  Continue heparin gtt at 1700 units/hr Daily heparin level, CBC, s/s bleeding F/u plan for PO transition  Bertis Ruddy, PharmD Clinical Pharmacist Please check AMION for all [place] numbers 01/04/2020 2:10 PM

## 2020-01-04 NOTE — Progress Notes (Signed)
SATURATION QUALIFICATIONS: (This note is used to comply with regulatory documentation for home oxygen)  Patient Saturations on Room Air at Rest = 96%  Patient Saturations on Room Air while Ambulating = 94-96%  Patient Saturations on 0 Liters of oxygen while Ambulating = N/A %  Please briefly explain why patient needs home oxygen: pt did not require O2 while ambulating he maintained a saturation in the mid- high 90's

## 2020-01-04 NOTE — Progress Notes (Signed)
ANTICOAGULATION CONSULT NOTE  Pharmacy Consult for heparin  Indication: pulmonary embolus  No Known Allergies  Patient Measurements: Heparin dosing wt: 100kg  HEPARIN DW (KG): 99.6   Vital Signs: Temp: 98.4 F (36.9 C) (09/10 1253) Temp Source: Oral (09/10 1253) BP: 142/95 (09/10 1253) Pulse Rate: 86 (09/10 1253)  Labs: Recent Labs    01/03/20 1424 01/03/20 2031 01/04/20 0018 01/04/20 0602 01/04/20 1311  HGB 15.7  --   --  14.8  --   HCT 49.9  --   --  45.4  --   PLT 199  --   --  199  --   HEPARINUNFRC  --   --   --  0.54 0.41  CREATININE 1.26*  --   --  1.11  --   TROPONINIHS  --  50* 44*  --   --     Estimated Creatinine Clearance: 97.1 mL/min (by C-G formula based on SCr of 1.11 mg/dL).   Medical History: Past Medical History:  Diagnosis Date  . Degenerative joint disease (DJD) of hip    Left  . G6PD deficiency   . Pituitary tumor 2017   "took RX to shrink it" (11/22/2017)  . Seasonal allergies    "spring; pollen" (11/22/2017)      Assessment: 54 yo male with CT showing PE. Pharmacy consulted to dose heparin. No anticoagulants noted PTA.  Heparin level remains therapeutic on 1700 units/hr, doppler today showing DVT also.  Goal of Therapy:  Heparin level 0.3-0.7 units/ml Monitor platelets by anticoagulation protocol: Yes   Plan:  Continue heparin gtt at 1700 units/hr Daily heparin level, CBC, s/s bleeding F/u plan for PO transition  Addendum: Now to transition to Xarelto D/c heparin gtt Give Xarelto 15mg  PO BID x 21 days, then 20mg  PO daily thereafter  Bertis Ruddy, PharmD Clinical Pharmacist Please check AMION for all [place] numbers 01/04/2020 2:53 PM

## 2020-01-04 NOTE — Progress Notes (Signed)
  Echocardiogram 2D Echocardiogram has been performed.  Ivan [name] Azarel Banner 01/04/2020, 8:38 AM

## 2020-01-04 NOTE — Progress Notes (Signed)
Bilateral lower Ext. study completed.   See CVProc for preliminary results.   Griffin Basil, RDMS, RVT

## 2020-01-04 NOTE — Discharge Summary (Signed)
Physician Discharge Summary  Ivan [name]:[id number] DOB: 06/16/1965 DOA: 01/03/2020  PCP: Maude Leriche, PA-C  Admit date: 01/03/2020 Discharge date: 01/04/2020  Admitted From: Home Discharge disposition: Home   Recommendations for Outpatient Follow-Up:   1. Close outpatient follow-up with endocrinologist: Testosterone held until further discussions had 2. Multiple labs pending for hypercoagulable work-up  Discharge Diagnosis:   Principal Problem:   Acute pulmonary embolus (HCC) Active Problems:   Male hypogonadism   Obesity, Class I, BMI 30.0-34.9 (see actual BMI)   Prolactinoma (HCC)   G6PD deficiency    Discharge Condition: Improved.  Diet recommendation: Low sodium, heart healthy.   Wound care: None.  Code status: Full.   History of Present Illness:   Ivan [name] is a 54 y.o. male with Past medical history of pituitary tumor SP radiotherapy, G6PD deficiency, remote left hip arthroplasty, testosterone deficiency on testosterone therapy. Patient presents with complaints of frequent syncopal events, shortness of breath on exertion as well as right leg pain. 2 weeks ago patient was at his baseline.  At his baseline patient is fairly active, exercising 60+ minutes every day and trying to lose weight. He is started having gradual worsening of his breathing 2 weeks ago with exertion. He denies having any complaints of chest pain or chest tightness.  No fever no chills.  No cough.  No nausea no vomiting no diarrhea.  No bleeding.  No abdominal pain.  No lumps or bumps anywhere.  No change in the medications. 1 week ago simple walking up the hill was causing severe shortness of breath and patient was not able to walk on a treadmill for more than 10 minutes.  1 week ago he also traveled to [place] for a 3-hour drive.  At the same time he also started having pain in his right leg. Last Monday patient had a passing out event at work. Covid test has been  negative. No family history of clotting disorder. Patient actually saw cardiology in the clinic and due to concerning EKG changes he was referred to ER for further work-up.   Hospital Course by Problem:    Syncope due to Acute pulmonary embolus (HCC)  Moderate to large clot burden.  not hypoxic or tachycardic or hypotensive. Heparin has been transitioned to Xarelto Echo:Left ventricular ejection fraction, by estimation, is 60 to 65%. The  left ventricle has normal function. The left ventricle has no regional  wall motion abnormalities. There is mild left ventricular hypertrophy.  Left ventricular diastolic parameters  are consistent with Grade I diastolic dysfunction (impaired relaxation).   Right leg pain -Resolved -DVT pounds -On anticoagulation -Patient encouraged to elevate extremity as well as wear compression stockings   Male hypogonadism. Patient is on testosterone therapy.   Could have been the cause for patient's PE although patient has been doing this therapy for a while. -Testosterone will be stopped until patient can follow-up with his endocrinologist at [facility] for further discussion   Obesity Patient is trying to lose weight and has lost 10 pounds in last 9 months with exercise.   Prolactinoma. Patient is on cabergoline will continue. He takes 1 mg once weekly  Abnormal EKG. This is likely in the setting of PE. Follow-up outpatient with cardiology    Medical Consultants:    Spoke with patient's endocrinologist at [facility] to update  Discharge Exam:   Vitals:   01/04/20 0558 01/04/20 1253  BP: (!) 142/98 (!) 142/95  Pulse: 83 86  Resp: 20 18  Temp: 99.1 F (37.3 C) 98.4 F (36.9 C)  SpO2: 94% 97%   Vitals:   01/04/20 0212 01/04/20 0213 01/04/20 0558 01/04/20 1253  BP:  (!) 147/98 (!) 142/98 (!) 142/95  Pulse:  84 83 86  Resp:  18 20 18   Temp:  98.9 F (37.2 C) 99.1 F (37.3 C) 98.4 F (36.9 C)  TempSrc:  Oral Oral Oral  SpO2:   95% 94% 97%  Weight: 112.5 kg     Height: 5\' 11"  (1.803 m)       General exam: Appears calm and comfortable.  Patient ambulatory without pain in his leg nor dizziness nor shortness of breath. Patient states his legs are much improved from prior day   The results of significant diagnostics from this hospitalization (including imaging, microbiology, ancillary and laboratory) are listed below for reference.     Procedures and Diagnostic Studies:   CT Angio Chest PE W and/or Wo Contrast  Result Date: 01/03/2020 CLINICAL DATA:  PE suspected, high prob Shortness of breath and exertional dyspnea.  Elevated D-dimer. EXAM: CT ANGIOGRAPHY CHEST WITH CONTRAST TECHNIQUE: Multidetector CT imaging of the chest was performed using the standard protocol during bolus administration of intravenous contrast. Multiplanar CT image reconstructions and MIPs were obtained to evaluate the vascular anatomy. CONTRAST:  16mL OMNIPAQUE IOHEXOL 350 MG/ML SOLN COMPARISON:  Chest radiograph yesterday. FINDINGS: Cardiovascular: Positive for acute bilateral pulmonary embolus with large bilateral pulmonary emboli. There are filling defects within the distal bilateral main pulmonary arteries extending into all lobar and many segmental branches. Thromboembolic burden is moderate to large. There is no saddle embolus. No evidence of left atrial thrombus. Elevated RV to LV ratio of 1.06. Mild straightening of the intraventricular septum with minimal contrast refluxing into the hepatic veins and IVC. Borderline cardiomegaly. There is no pericardial effusion. Trace aortic atherosclerosis without dissection. Conventional branching pattern from the aortic arch. Mediastinum/Nodes: No mediastinal or hilar adenopathy. Tiny hiatal hernia. No suspicious thyroid nodule. Lungs/Pleura: Minimal subpleural opacity in the periphery of the right lower lobe may represent small infarct. Additional hypoventilatory changes in the dependent lungs. No confluent  airspace disease. No findings of pulmonary edema or pleural effusion. Mild retained mucus in the trachea. Upper Abdomen: Minimal contrast refluxing into the hepatic veins and IVC. Bilateral renal cysts partially included. Musculoskeletal: There are no acute or suspicious osseous abnormalities. Review of the MIP images confirms the above findings. IMPRESSION: 1. Positive for acute bilateral pulmonary embolus with filling defects involving both main pulmonary arteries. Thromboembolic burden is moderate to large. Elevated RV to LV ratio of 1.06. Mild straightening of the intraventricular septum with minimal contrast refluxing into the hepatic veins and IVC, suggesting elevated right heart pressures. 2. Minimal subpleural opacity in the periphery of the right lower lobe may represent small infarct. Aortic Atherosclerosis (ICD10-I70.0). Critical Value/emergent results were called by telephone at the time of interpretation on 01/03/2020 at 9:45 pm to [place] , who verbally acknowledged these results. Electronically Signed   By: Keith Rake M.D.   On: 01/03/2020 21:45   ECHOCARDIOGRAM COMPLETE  Result Date: 01/04/2020    ECHOCARDIOGRAM REPORT   Patient Name:   Ivan [name] Date of Exam: 01/04/2020 Medical Rec #:  [id number]         Height:       71.0 in Accession #:    [id number]        Weight:       248.0 lb Date of  Birth:  1965/12/23         BSA:          2.310 m Patient Age:    79 years          BP:           142/98 mmHg Patient Gender: M                 HR:           82 bpm. Exam Location:  Inpatient Procedure: 2D Echo, Cardiac Doppler and Color Doppler Indications:    R55 Syncope  History:        Patient has no prior history of Echocardiogram examinations.  Sonographer:    Jonelle Sidle Dance Referring Phys: [id number] [place]  1. Left ventricular ejection fraction, by estimation, is 60 to 65%. The left ventricle has normal function. The left ventricle has no regional wall motion  abnormalities. There is mild left ventricular hypertrophy. Left ventricular diastolic parameters are consistent with Grade I diastolic dysfunction (impaired relaxation).  2. Right ventricular systolic function is normal. The right ventricular size is normal.  3. The mitral valve is normal in structure. No evidence of mitral valve regurgitation. No evidence of mitral stenosis.  4. The aortic valve is normal in structure. Aortic valve regurgitation is not visualized. No aortic stenosis is present. FINDINGS  Left Ventricle: Left ventricular ejection fraction, by estimation, is 60 to 65%. The left ventricle has normal function. The left ventricle has no regional wall motion abnormalities. The left ventricular internal cavity size was small. There is mild left ventricular hypertrophy. Left ventricular diastolic parameters are consistent with Grade I diastolic dysfunction (impaired relaxation). Right Ventricle: The right ventricular size is normal. No increase in right ventricular wall thickness. Right ventricular systolic function is normal. Left Atrium: Left atrial size was normal in size. Right Atrium: Right atrial size was normal in size. Pericardium: There is no evidence of pericardial effusion. Mitral Valve: The mitral valve is normal in structure. No evidence of mitral valve regurgitation. No evidence of mitral valve stenosis. Tricuspid Valve: The tricuspid valve is normal in structure. Tricuspid valve regurgitation is not demonstrated. Aortic Valve: The aortic valve is normal in structure. Aortic valve regurgitation is not visualized. No aortic stenosis is present. Pulmonic Valve: The pulmonic valve was normal in structure. Pulmonic valve regurgitation is not visualized. Aorta: The aortic root and ascending aorta are structurally normal, with no evidence of dilitation. IAS/Shunts: The atrial septum is grossly normal.  LEFT VENTRICLE PLAX 2D LVIDd:         3.50 cm  Diastology LVIDs:         2.20 cm  LV e' medial:     5.00 cm/s LV PW:         1.20 cm  LV E/e' medial:  13.0 LV IVS:        1.50 cm  LV e' lateral:   9.14 cm/s LVOT diam:     2.10 cm  LV E/e' lateral: 7.1 LV SV:         87 LV SV Index:   38 LVOT Area:     3.46 cm  RIGHT VENTRICLE            IVC RV Basal diam:  3.10 cm    IVC diam: 1.40 cm RV Mid diam:    2.40 cm RV S prime:     9.90 cm/s TAPSE (M-mode): 1.8 cm LEFT ATRIUM  Index       RIGHT ATRIUM           Index LA diam:        3.50 cm 1.51 cm/m  RA Area:     17.20 cm LA Vol (A2C):   45.2 ml 19.56 ml/m RA Volume:   47.10 ml  20.39 ml/m LA Vol (A4C):   31.7 ml 13.72 ml/m LA Biplane Vol: 38.0 ml 16.45 ml/m  AORTIC VALVE LVOT Vmax:   154.50 cm/s LVOT Vmean:  97.300 cm/s LVOT VTI:    0.251 m  AORTA Ao Root diam: 3.40 cm Ao Asc diam:  3.20 cm MITRAL VALVE MV Area (PHT): 1.75 cm    SHUNTS MV Decel Time: 433 msec    Systemic VTI:  0.25 m MV E velocity: 65.10 cm/s  Systemic Diam: 2.10 cm MV A velocity: 79.70 cm/s MV E/A ratio:  0.82 Mertie Moores MD Electronically signed by Mertie Moores MD Signature Date/Time: 01/04/2020/10:58:45 AM    Final    VAS [place] LOWER EXTREMITY VENOUS (DVT)  Result Date: 01/04/2020  Lower Venous DVTStudy Indications: Swelling, and pulmonary embolism.  Risk Factors: Confirmed PE. Performing Technologist: Griffin Basil RCT RDMS  Examination Guidelines: A complete evaluation includes B-mode imaging, spectral Doppler, color Doppler, and power Doppler as needed of all accessible portions of each vessel. Bilateral testing is considered an integral part of a complete examination. Limited examinations for reoccurring indications may be performed as noted. The reflux portion of the exam is performed with the patient in reverse Trendelenburg.  +---------+---------------+---------+-----------+---------------+--------------+ RIGHT    CompressibilityPhasicitySpontaneityProperties     Thrombus Aging +---------+---------------+---------+-----------+---------------+--------------+ CFV       Full           Yes      Yes                                      +---------+---------------+---------+-----------+---------------+--------------+ SFJ      Full                                                             +---------+---------------+---------+-----------+---------------+--------------+ FV Prox  Partial                            softly         Acute                                                      echogenic                     +---------+---------------+---------+-----------+---------------+--------------+ FV Mid   Partial                            softly         Acute  echogenic                     +---------+---------------+---------+-----------+---------------+--------------+ FV DistalNone                               softly         Acute                                                      echogenic                     +---------+---------------+---------+-----------+---------------+--------------+ PFV      None                               softly         Acute                                                      echogenic                     +---------+---------------+---------+-----------+---------------+--------------+ POP      None           No       No         softly         Acute                                                      echogenic                     +---------+---------------+---------+-----------+---------------+--------------+ PTV      None                               softly         Acute                                                      echogenic                     +---------+---------------+---------+-----------+---------------+--------------+ PERO     None                               softly         Acute  echogenic                      +---------+---------------+---------+-----------+---------------+--------------+   +---------+---------------+---------+-----------+----------+--------------+ LEFT     CompressibilityPhasicitySpontaneityPropertiesThrombus Aging +---------+---------------+---------+-----------+----------+--------------+ CFV      Full           Yes      Yes                                 +---------+---------------+---------+-----------+----------+--------------+ SFJ      Full                                                        +---------+---------------+---------+-----------+----------+--------------+ FV Prox  Full                                                        +---------+---------------+---------+-----------+----------+--------------+ FV Mid   Full                                                        +---------+---------------+---------+-----------+----------+--------------+ FV DistalFull                                                        +---------+---------------+---------+-----------+----------+--------------+ PFV      Full                                                        +---------+---------------+---------+-----------+----------+--------------+ POP      Full           Yes      Yes                                 +---------+---------------+---------+-----------+----------+--------------+ PTV      Full                                                        +---------+---------------+---------+-----------+----------+--------------+ PERO     Full                                                        +---------+---------------+---------+-----------+----------+--------------+     Summary: RIGHT: - Findings consistent with acute deep vein thrombosis involving the right femoral vein, right popliteal vein,  right posterior tibial veins, right peroneal veins, and right gastrocnemius veins. - No cystic structure found in the popliteal fossa.   LEFT: - There is no evidence of deep vein thrombosis in the lower extremity.  - No cystic structure found in the popliteal fossa.  *See table(s) above for measurements and observations.    Preliminary      Labs:   Basic Metabolic Panel: Recent Labs  Lab 01/03/20 1424 01/04/20 0602  NA 139 140  K 3.7 3.9  CL 105 107  CO2 24 24  GLUCOSE 86 101*  BUN 12 11  CREATININE 1.26* 1.11  CALCIUM 9.0 9.0   GFR Estimated Creatinine Clearance: 97.1 mL/min (by C-G formula based on SCr of 1.11 mg/dL). Liver Function Tests: Recent Labs  Lab 01/04/20 0602  AST 30  ALT 33  ALKPHOS 53  BILITOT 1.2  PROT 6.7  ALBUMIN 3.4*   No results for input(s): LIPASE, AMYLASE in the last 168 hours. No results for input(s): AMMONIA in the last 168 hours. Coagulation profile No results for input(s): INR, PROTIME in the last 168 hours.  CBC: Recent Labs  Lab 01/03/20 1424 01/04/20 0602  WBC 6.2 6.8  HGB 15.7 14.8  HCT 49.9 45.4  MCV 94.2 90.3  PLT 199 199   Cardiac Enzymes: No results for input(s): CKTOTAL, CKMB, CKMBINDEX, TROPONINI in the last 168 hours. BNP: Invalid input(s): POCBNP CBG: No results for input(s): GLUCAP in the last 168 hours. D-Dimer Recent Labs    01/03/20 2031  DDIMER 15.50*   Hgb A1c No results for input(s): HGBA1C in the last 72 hours. Lipid Profile No results for input(s): CHOL, HDL, LDLCALC, TRIG, CHOLHDL, LDLDIRECT in the last 72 hours. Thyroid function studies No results for input(s): TSH, T4TOTAL, T3FREE, THYROIDAB in the last 72 hours.  Invalid input(s): FREET3 Anemia work up No results for input(s): VITAMINB12, FOLATE, FERRITIN, TIBC, IRON, RETICCTPCT in the last 72 hours. Microbiology Recent Results (from the past 240 hour(s))  SARS Coronavirus 2 by RT PCR (hospital order, performed in [facility] hospital lab) Nasopharyngeal Nasopharyngeal Swab     Status: None   Collection Time: 01/04/20 12:28 AM   Specimen: Nasopharyngeal Swab  Result Value  Ref Range Status   SARS Coronavirus 2 NEGATIVE NEGATIVE Final    Comment: (NOTE) SARS-CoV-2 target nucleic acids are NOT DETECTED.  The SARS-CoV-2 RNA is generally detectable in upper and lower respiratory specimens during the acute phase of infection. The lowest concentration of SARS-CoV-2 viral copies this assay can detect is 250 copies / mL. A negative result does not preclude SARS-CoV-2 infection and should not be used as the sole basis for treatment or other patient management decisions.  A negative result may occur with improper specimen collection / handling, submission of specimen other than nasopharyngeal swab, presence of viral mutation(s) within the areas targeted by this assay, and inadequate number of viral copies (<250 copies / mL). A negative result must be combined with clinical observations, patient history, and epidemiological information.  Fact Sheet for Patients:   StrictlyIdeas.no  Fact Sheet for Healthcare Providers: BankingDealers.co.za  This test is not yet approved or  cleared by the [place] FDA and has been authorized for detection and/or diagnosis of SARS-CoV-2 by FDA under an Emergency Use Authorization (EUA).  This EUA will remain in effect (meaning this test can be used) for the duration of the COVID-19 declaration under Section 564(b)(1) of the Act, 21 U.S.C. section 360bbb-3(b)(1), unless the authorization is terminated or revoked  sooner.  Performed at [facility], [place] [address], [place], [place] [address]      Discharge Instructions:   Discharge Instructions    Diet - low sodium heart healthy   Complete by: As directed    Discharge instructions   Complete by: As directed    Close follow up with PCP-- labs for hypercoagulable work up pending Follow up with endocrinology to discuss medications-- call for appointment   Increase activity slowly   Complete by: As directed       Allergies as of 01/04/2020   No Known Allergies     Medication List    STOP taking these medications   ibuprofen 200 MG tablet Commonly known as: ADVIL   Testim 50 MG/5GM (1%) Gel Generic drug: testosterone     TAKE these medications   cabergoline 0.5 MG tablet Commonly known as: DOSTINEX Take 2 tablets (1 mg total) by mouth once a week. What changed:   how much to take  when to take this   multivitamin capsule Take 1 capsule by mouth daily.   Rivaroxaban Stater Pack (15 mg and 20 mg) Commonly known as: XARELTO STARTER PACK Follow package directions: Take one 15mg  tablet by mouth twice a day. On day 22, switch to one 20mg  tablet once a day. Take with food.       Follow-up Information    Scifres, Earlie Server, PA-C Follow up in 1 week(s).   Specialty: Physician Assistant Contact information: [place] [address] [phone]                Time coordinating discharge: 35 minutes  Signed:  Geradine Girt DO  [facility] 01/04/2020, 3:17 PM

## 2020-01-05 LAB — BETA-2-GLYCOPROTEIN I ABS, IGG/M/A
Beta-2 Glyco I IgG: <9 GPI IgG units (ref 0–20)
Beta-2-Glycoprotein I IgA: <9 GPI IgA units (ref 0–25)
Beta-2-Glycoprotein I IgM: <9 GPI IgM units (ref 0–32)

## 2020-01-05 LAB — LUPUS ANTICOAGULANT PANEL
DRVVT: 39.5 s (ref 0.0–47.0)
PTT Lupus Anticoagulant: 36.8 s (ref 0.0–51.9)

## 2020-01-05 LAB — HOMOCYSTEINE: Homocysteine: 12.8 umol/L (ref 0.0–14.5)

## 2020-01-05 LAB — PROTEIN C ACTIVITY: Protein C Activity: 78 % (ref 73–180)

## 2020-01-05 LAB — PROTEIN S ACTIVITY: Protein S Activity: 42 % — ABNORMAL LOW (ref 63–140)

## 2020-01-05 LAB — PROTEIN C, TOTAL: Protein C, Total: 73 % (ref 60–150)

## 2020-01-05 LAB — PROTEIN S, TOTAL: Protein S Ag, Total: 93 % (ref 60–150)

## 2020-01-06 LAB — CARDIOLIPIN ANTIBODIES, IGG, IGM, IGA
Anticardiolipin IgA: <9 APL U/mL (ref 0–11)
Anticardiolipin IgG: <9 GPL U/mL (ref 0–14)
Anticardiolipin IgM: <9 MPL U/mL (ref 0–12)

## 2020-01-09 ENCOUNTER — Telehealth: Payer: Self-pay | Admitting: Internal Medicine

## 2020-01-09 LAB — FACTOR 5 LEIDEN

## 2020-01-09 LAB — PROTHROMBIN GENE MUTATION

## 2020-01-09 NOTE — Telephone Encounter (Signed)
[facility] TELEPHONE ENCOUNTER NOTE  Patient: Ivan [name] OMB:[id number]   PCP: Maude Leriche, PA-C DOB: [birth year]   DOS: 01/09/2020     Prothrombin gene opsitive heterozygoz.  Called pt, went to voicemail.  Called wife, informed the results. Recommended follow up with PCP and pt will benefit from hematology consultation.  Wife verbalized understanding. Informed wife that No change in management for now,but May need lifelong anticoagulation.   Author:  Berle Mull, MD [facility] 01/09/2020  If 7PM-7AM, please contact night-coverage To reach On-call, see [email or web address]

## 2020-01-18 ENCOUNTER — Telehealth: Payer: Self-pay | Admitting: Hematology and Oncology

## 2020-01-18 NOTE — Telephone Encounter (Signed)
Received a new hem referral from Dr. Moreen Fowler for: other pulmonary embolism without acute cor pulmonale. Ivan [name] has been cld and scheduled to see Dr. Lorenso Courier on 10/14 at [facility]. Pt aware to arrive 20 minutes early.

## 2020-01-30 ENCOUNTER — Ambulatory Visit: Payer: 59 | Admitting: Cardiology

## 2020-02-06 NOTE — Progress Notes (Signed)
[name]   Fax:([phone]  INITIAL CONSULT NOTE  Patient Care Team: Scifres, Durel Salts as PCP - General (Physician Assistant)  Hematological/Oncological History # Bilateral Pulmonary Embolism # Heterozygous for Prothrombin Gene Mutation.  01/03/2020: CTA showed acute bilateral pulmonary embolus with filling defects involving both main pulmonary arteries. Thromboembolic burden was moderate to large 01/04/2020: [place] Lower extremity showed with acute deep vein thrombosis involving the right  femoral vein, right popliteal vein, right posterior tibial veins, right peroneal veins, and right gastrocnemius veins 01/04/2020: extensive hypercoagulation workup showed patient was heterozygous for Prothrombin gene mutation. D/c on Xarelto therapy.  02/07/2020: establish care with Dr. Lorenso Courier   CHIEF COMPLAINTS/PURPOSE OF CONSULTATION:  "Bilateral Pulmonary Embolism in Setting of Heterozygous Prothrombin mutation"  HISTORY OF PRESENTING ILLNESS:  Ivan [name] 54 y.o. male with medical history significant for male hypogonadism, prolactinoma, G6P deficiency who presents for evaluation of recently diagnosed bilateral pulmonary embolism.   On review of the previous records Ivan [name] presented to the emergency department on 01/03/2020 at which time he had a CTA performed which showed acute bilateral pulmonary emboli with filling defects involving both the main pulmonary arteries.  The thrombotic burden was moderate to large at that time.  On 01/04/2020 the patient had an ultrasound of his lower extremities which showed acute deep vein thrombosis.  He underwent an extensive hypercoagulation work-up at that time which did show he was heterozygous for the prothrombin gene mutation.  He was discharged from the hospital at that time on Xarelto 15 mg p.o. twice daily.  Due to concern for the prothrombin gene mutation and his high burden clots he was referred to hematology  for further evaluation and management.  On exam today Ivan [name] is accompanied by his wife.  He notes that he initially began developing symptoms approximately 2 weeks before Labor Day.  He reports that he was having trouble breathing and he was unable to complete his usual workout routine.  He notes that he normally could work out for 60 to 90 minutes without difficulty but he felt like "I was going to pass out after 10 minutes".  He reports that he was having difficulty with ramps and stairs.  He eventually saw a cardiologist who recommended he present to the emergency department for further evaluation and management.  In the ED he was diagnosed with a DVT as well as bilateral PEs and was started on Xarelto therapy.  Ivan [name] notes that his symptoms have improved since he was started on anticoagulation therapy.  He notes that he is not quite back to 100%, but he is moving in that direction.  He reports that the soreness in his right leg has improved and his shortness of breath is no longer there.  He is not having any issues with bleeding, bruising, or dark stools.  He otherwise is having no symptoms at this time.  On further discussion he reports that he has history of a CVA in his maternal grandfather and maternal uncle.  He also notes his maternal grandmother had a heart attack.  His mother is 55 years old and healthy and he also has a sister and 2 children who do not have any health conditions.  He is a never smoker and only occasionally drinks alcohol.  He currently works in the [employer] here in [place].  He denies having issues today with fevers, chills, sweats, nausea, vomiting or diarrhea.  A full 10 point ROS is the blood.  MEDICAL HISTORY:  Past Medical History:  Diagnosis Date  . Degenerative joint disease (DJD) of hip    Left  . G6PD deficiency   . Pituitary tumor 2017   "took RX to shrink it" (11/22/2017)  . Seasonal allergies    "spring; pollen" (11/22/2017)     SURGICAL HISTORY: Past Surgical History:  Procedure Laterality Date  . TOTAL HIP ARTHROPLASTY Left 11/22/2017   Procedure: TOTAL HIP ARTHROPLASTY ANTERIOR APPROACH;  Surgeon: Melrose Nakayama, MD;  Location: [facility];  Service: Orthopedics;  Laterality: Left;    SOCIAL HISTORY: Social History   Socioeconomic History  . Marital status: Married    Spouse name: Not on file  . Number of children: 2  . Years of education: Not on file  . Highest education level: Not on file  Occupational History  . Not on file  Tobacco Use  . Smoking status: Never Smoker  . Smokeless tobacco: Never Used  Vaping Use  . Vaping Use: Never used  Substance and Sexual Activity  . Alcohol use: Yes    Alcohol/week: 3.0 standard drinks    Types: 3 Cans of beer per week  . Drug use: Never  . Sexual activity: Yes  Other Topics Concern  . Not on file  Social History Narrative  . Not on file   Social Determinants of Health   Financial Resource Strain:   . Difficulty of Paying Living Expenses: Not on file  Food Insecurity:   . Worried About Charity fundraiser in the Last Year: Not on file  . Ran Out of Food in the Last Year: Not on file  Transportation Needs:   . Lack of Transportation (Medical): Not on file  . Lack of Transportation (Non-Medical): Not on file  Physical Activity:   . Days of Exercise per Week: Not on file  . Minutes of Exercise per Session: Not on file  Stress:   . Feeling of Stress : Not on file  Social Connections:   . Frequency of Communication with Friends and Family: Not on file  . Frequency of Social Gatherings with Friends and Family: Not on file  . Attends Religious Services: Not on file  . Active Member of Clubs or Organizations: Not on file  . Attends Archivist Meetings: Not on file  . Marital Status: Not on file  Intimate Partner Violence:   . Fear of Current or Ex-Partner: Not on file  . Emotionally Abused: Not on file  . Physically Abused: Not on file   . Sexually Abused: Not on file    FAMILY HISTORY: Family History  Problem Relation Age of Onset  . Hypertension Mother   . Mental retardation Mother   . Cancer Maternal Grandmother   . Stroke Maternal Grandfather     ALLERGIES:  has No Known Allergies.  MEDICATIONS:  Current Outpatient Medications  Medication Sig Dispense Refill  . cabergoline (DOSTINEX) 0.5 MG tablet Take 2 tablets (1 mg total) by mouth once a week. 10 tablet 0  . Multiple Vitamin (MULTIVITAMIN) capsule Take 1 capsule by mouth daily.    . rivaroxaban (XARELTO) 20 MG TABS tablet Take 20 mg by mouth daily with supper.     No current facility-administered medications for this visit.    REVIEW OF SYSTEMS:   Constitutional: ( - ) fevers, ( - )  chills , ( - ) night sweats Eyes: ( - ) blurriness of vision, ( - ) double vision, ( - ) watery eyes Ears, nose,  mouth, throat, and face: ( - ) mucositis, ( - ) sore throat Respiratory: ( - ) cough, ( - ) dyspnea, ( - ) wheezes Cardiovascular: ( - ) palpitation, ( - ) chest discomfort, ( - ) lower extremity swelling Gastrointestinal:  ( - ) nausea, ( - ) heartburn, ( - ) change in bowel habits Skin: ( - ) abnormal skin rashes Lymphatics: ( - ) new lymphadenopathy, ( - ) easy bruising Neurological: ( - ) numbness, ( - ) tingling, ( - ) new weaknesses Behavioral/Psych: ( - ) mood change, ( - ) new changes  All other systems were reviewed with the patient and are negative.  PHYSICAL EXAMINATION: ECOG PERFORMANCE STATUS: 1 - Symptomatic but completely ambulatory  Vitals:   02/07/20 0912  BP: 124/90  Pulse: 83  Resp: 20  Temp: 97.9 F (36.6 C)  SpO2: 100%   Filed Weights   02/07/20 0912  Weight: 245 lb 8 oz (111.4 kg)    GENERAL: well appearing middle aged [place] American male in NAD  SKIN: skin color, texture, turgor are normal, no rashes or significant lesions EYES: conjunctiva are pink and non-injected, sclera clear LUNGS: clear to auscultation and  percussion with normal breathing effort HEART: regular rate & rhythm and no murmurs and no lower extremity edema Musculoskeletal: no cyanosis of digits and no clubbing  PSYCH: alert & oriented x 3, fluent speech NEURO: no focal motor/sensory deficits  LABORATORY DATA:  I have reviewed the data as listed CBC Latest Ref Rng & Units 02/07/2020 01/04/2020 01/03/2020  WBC 4.0 - 10.5 K/uL 5.4 6.8 6.2  Hemoglobin 13.0 - 17.0 g/dL 14.4 14.8 15.7  Hematocrit 39 - 52 % 44.0 45.4 49.9  Platelets 150 - 400 K/uL 191 199 199    CMP Latest Ref Rng & Units 02/07/2020 01/04/2020 01/03/2020  Glucose 70 - 99 mg/dL 85 101(H) 86  BUN 6 - 20 mg/dL 16 11 12   Creatinine 0.61 - 1.24 mg/dL 1.30(H) 1.11 1.26(H)  Sodium 135 - 145 mmol/L 143 140 139  Potassium 3.5 - 5.1 mmol/L 4.2 3.9 3.7  Chloride 98 - 111 mmol/L 107 107 105  CO2 22 - 32 mmol/L 31 24 24   Calcium 8.9 - 10.3 mg/dL 10.0 9.0 9.0  Total Protein 6.5 - 8.1 g/dL 8.1 6.7 -  Total Bilirubin 0.3 - 1.2 mg/dL 0.9 1.2 -  Alkaline Phos 38 - 126 U/L 59 53 -  AST 15 - 41 U/L 28 30 -  ALT 0 - 44 U/L 26 33 -    RADIOGRAPHIC STUDIES: No results found.  ASSESSMENT & PLAN Ivan [name] 54 y.o. male with medical history significant for male hypogonadism, prolactinoma, G6P deficiency who presents for evaluation of recently diagnosed bilateral pulmonary embolism.  After review the labs, review the records, discussion with the patient the findings are most consistent with an unprovoked bilateral pulmonary embolism and right lower extremity DVT.  There may be numerous contributing factors to this including his heterozygous mutation for prothrombin gene and his exogenous testosterone supplementation due to his hypogonadism.  The patient denied having any clear inciting incidents such as prolonged travel, surgery, or trauma.  He is also not taking any other medications.  At this time would recommend proceeding with Xarelto 20 mg p.o. daily for at least 6 months duration  with consideration of decreasing down to maintenance dosing after that time.  The patient will require lifelong anticoagulation in the setting of unprovoked bilateral pulmonary embolism.  There is no  need to discontinue his exogenous testosterone as he is currently well protected from VTE on Xarelto therapy.  Additionally there are no other changes that need to be made in light of his prothrombin gene mutation.  # Unprovoked Bilateral Pulmonary Embolism # Heterozygous for Prothrombin Gene Mutation  --recommend indefinite lifelong anticoagulation due to unprovoked large pulmonary embolism.  --continue Xarelto 15mg  BID with transition to 20mg  PO daily.  --assure the patient has had age appropriate cancer screenings (colonoscopy)  --once patient has completed 6 months of Xarelto therapy can decrease him from 20mg  Xarelto to 10mg   maintenance dosing if he chooses.  --baseline CBC and CMP today --plan for RTC in March 2022 to discuss maintenance dosing  Orders Placed This Encounter  Procedures  . CMP ([facility] only)    Standing Status:   Future    Number of Occurrences:   1    Standing Expiration Date:   02/06/2021  . CBC with Differential (Cancer Center Only)    Standing Status:   Future    Number of Occurrences:   1    Standing Expiration Date:   02/06/2021    All questions were answered. The patient knows to call the clinic with any problems, questions or concerns.  A total of more than 60 minutes were spent on this encounter and over half of that time was spent on counseling and coordination of care as outlined above.   Ledell Peoples, MD Department of Hematology/Oncology [place] [facility] Phone: [phone] Pager: [phone] Email: [email or web address]  Literature:   Darlina Sicilian, Zakai NA, MacLehose RF, Cowan LT, Adam TJ, Alonso A, Lutsey PL. Association of Testosterone Therapy With Risk of Venous Thromboembolism Among Men With and  Without Hypogonadism. [place] Feb 1;180(2):190-197.   --Testosterone therapy was associated with an increase in short-term risk for VTE among men with and without hypogonadism, with some evidence that the association was more pronounced among younger men. These findings suggest that caution should be used when prescribing testosterone therapy.  02/07/2020 12:05 PM

## 2020-02-07 ENCOUNTER — Inpatient Hospital Stay: Payer: 59

## 2020-02-07 ENCOUNTER — Encounter: Payer: Self-pay | Admitting: Hematology and Oncology

## 2020-02-07 ENCOUNTER — Inpatient Hospital Stay: Payer: 59 | Attending: Hematology and Oncology | Admitting: Hematology and Oncology

## 2020-02-07 ENCOUNTER — Other Ambulatory Visit: Payer: Self-pay

## 2020-02-07 VITALS — BP 124/90 | HR 83 | Temp 97.9°F | Resp 20 | Ht 71.0 in | Wt 245.5 lb

## 2020-02-07 DIAGNOSIS — I2699 Other pulmonary embolism without acute cor pulmonale: Secondary | ICD-10-CM | POA: Diagnosis present

## 2020-02-07 DIAGNOSIS — E291 Testicular hypofunction: Secondary | ICD-10-CM | POA: Insufficient documentation

## 2020-02-07 DIAGNOSIS — I82401 Acute embolism and thrombosis of unspecified deep veins of right lower extremity: Secondary | ICD-10-CM | POA: Diagnosis not present

## 2020-02-07 DIAGNOSIS — D6852 Prothrombin gene mutation: Secondary | ICD-10-CM | POA: Diagnosis not present

## 2020-02-07 DIAGNOSIS — Z7901 Long term (current) use of anticoagulants: Secondary | ICD-10-CM

## 2020-02-07 DIAGNOSIS — Z79890 Hormone replacement therapy (postmenopausal): Secondary | ICD-10-CM | POA: Diagnosis not present

## 2020-02-07 LAB — CBC WITH DIFFERENTIAL (CANCER CENTER ONLY)
Abs Immature Granulocytes: 0.01 10*3/uL (ref 0.00–0.07)
Basophils Absolute: 0 10*3/uL (ref 0.0–0.1)
Basophils Relative: 1 %
Eosinophils Absolute: 0.3 10*3/uL (ref 0.0–0.5)
Eosinophils Relative: 5 %
HCT: 44 % (ref 39.0–52.0)
Hemoglobin: 14.4 g/dL (ref 13.0–17.0)
Immature Granulocytes: 0 %
Lymphocytes Relative: 35 %
Lymphs Abs: 1.9 10*3/uL (ref 0.7–4.0)
MCH: 28.8 pg (ref 26.0–34.0)
MCHC: 32.7 g/dL (ref 30.0–36.0)
MCV: 88 fL (ref 80.0–100.0)
Monocytes Absolute: 0.6 10*3/uL (ref 0.1–1.0)
Monocytes Relative: 11 %
Neutro Abs: 2.6 10*3/uL (ref 1.7–7.7)
Neutrophils Relative %: 48 %
Platelet Count: 191 10*3/uL (ref 150–400)
RBC: 5 MIL/uL (ref 4.22–5.81)
RDW: 12.2 % (ref 11.5–15.5)
WBC Count: 5.4 10*3/uL (ref 4.0–10.5)
nRBC: 0 % (ref 0.0–0.2)

## 2020-02-07 LAB — CMP (CANCER CENTER ONLY)
ALT: 26 U/L (ref 0–44)
AST: 28 U/L (ref 15–41)
Albumin: 4.1 g/dL (ref 3.5–5.0)
Alkaline Phosphatase: 59 U/L (ref 38–126)
Anion gap: 5 (ref 5–15)
BUN: 16 mg/dL (ref 6–20)
CO2: 31 mmol/L (ref 22–32)
Calcium: 10 mg/dL (ref 8.9–10.3)
Chloride: 107 mmol/L (ref 98–111)
Creatinine: 1.3 mg/dL — ABNORMAL HIGH (ref 0.61–1.24)
GFR, Estimated: >60 mL/min (ref >60)
Glucose, Bld: 85 mg/dL (ref 70–99)
Potassium: 4.2 mmol/L (ref 3.5–5.1)
Sodium: 143 mmol/L (ref 135–145)
Total Bilirubin: 0.9 mg/dL (ref 0.3–1.2)
Total Protein: 8.1 g/dL (ref 6.5–8.1)

## 2020-02-08 ENCOUNTER — Telehealth: Payer: Self-pay | Admitting: Hematology and Oncology

## 2020-02-08 NOTE — Telephone Encounter (Signed)
Scheduled per los. Called and left msg. Mailed printout  °

## 2020-03-06 ENCOUNTER — Ambulatory Visit: Payer: 59 | Admitting: Cardiovascular Disease

## 2020-03-06 ENCOUNTER — Other Ambulatory Visit: Payer: Self-pay

## 2020-03-06 VITALS — BP 122/84 | HR 65 | Ht 71.0 in | Wt 252.5 lb

## 2020-03-06 DIAGNOSIS — D352 Benign neoplasm of pituitary gland: Secondary | ICD-10-CM

## 2020-03-06 DIAGNOSIS — E668 Other obesity: Secondary | ICD-10-CM

## 2020-03-06 DIAGNOSIS — I422 Other hypertrophic cardiomyopathy: Secondary | ICD-10-CM | POA: Diagnosis not present

## 2020-03-06 DIAGNOSIS — E785 Hyperlipidemia, unspecified: Secondary | ICD-10-CM | POA: Diagnosis not present

## 2020-03-06 DIAGNOSIS — I2782 Chronic pulmonary embolism: Secondary | ICD-10-CM | POA: Diagnosis not present

## 2020-03-06 NOTE — Patient Instructions (Signed)

## 2020-03-06 NOTE — Progress Notes (Signed)
Cardiology consultation note:    Date:  03/08/2020   ID:  Ivan [name], DOB [birth year], MRN [id number]  PCP:  Maude Leriche, PA-C  [facility] Cardiologist:  Sanda Klein, MD [facility] Electrophysiologist:  None   Referring MD: Antony Contras, MD   Chief Complaint  Patient presents with  . Advice Only  New patient Ivan [name] is a 54 y.o. male who is being seen today for the evaluation of abnormal ECG at the request of Antony Contras, MD.   History of Present Illness:    DUTCH ING is a 54 y.o. male with a hx of recent pulmonary embolism due to lower extremity DVT, prothrombin gene mutation heterozygote, dyslipidemia (low HDL), left hip osteoarthritis status post replacement, moderate obesity, hypoandrogenism,  prolactinoma treated with cabergoline.  Roughly a month ago he had an episode of near syncope and severe shortness of breath and was noticed with bilateral pulmonary embolism.  This was not associated with any injury, surgery or immobilization.  He is currently on anticoagulation with Xarelto.  He has never previously experienced problems with dyspnea or syncope and has not had any issues since the initial event.  He denies angina or pleuritic chest pain.  He does not have orthopnea or PND.  He denies palpitations he does not have lower extremity edema or claudication or focal neurological complaints.  He has a markedly abnormal EKG.  It shows normal sinus rhythm with left axis deviation and left ventricular hypertrophy with very prominent deep inverted T waves in leads V3 to V6 as well as in the inferior leads.  Looking back in his chart a very similar ECG was seen in 2009 been in the same pattern of abnormalities (although with a less severe degree of T wave inversion) was seen in 2011.  He does not have systemic hypertension.  He underwent an echocardiogram in September when he had his pulmonary embolism.  This described "mild left ventricular  hypertrophy" and "grade 1 diastolic dysfunction (impaired laxation".  The study was otherwise normal and the LVEF was 60-65% with normal regional wall motion.  The left atrium was not dilated.  There was no aortic stenosis.  On my review of the echocardiogram I am highly suspicious that he has apical variant hypertrophic cardiomyopathy.  There is virtual obliteration of the cavity in the distal 25% of the long axis of the left ventricle.  Except for the apex, there is a very mild degree of LVH.  He has no history of ventricular arrhythmia.  There is no family history of unexplained sudden cardiac death or arrhythmia or congestive heart failure.  His wife Ivan [name] has been my patient.   Past Medical History:  Diagnosis Date  . Degenerative joint disease (DJD) of hip    Left  . G6PD deficiency   . Pituitary tumor 2017   "took RX to shrink it" (11/22/2017)  . Seasonal allergies    "spring; pollen" (11/22/2017)    Past Surgical History:  Procedure Laterality Date  . TOTAL HIP ARTHROPLASTY Left 11/22/2017   Procedure: TOTAL HIP ARTHROPLASTY ANTERIOR APPROACH;  Surgeon: Melrose Nakayama, MD;  Location: [facility];  Service: Orthopedics;  Laterality: Left;    Current Medications: No outpatient medications have been marked as taking for the 03/06/20 encounter (Office Visit) with Sanda Klein, MD.     Allergies:   Patient has no known allergies.   Social History   Socioeconomic History  . Marital status: Married    Spouse name: Not  on file  . Number of children: 2  . Years of education: Not on file  . Highest education level: Not on file  Occupational History  . Not on file  Tobacco Use  . Smoking status: Never Smoker  . Smokeless tobacco: Never Used  Vaping Use  . Vaping Use: Never used  Substance and Sexual Activity  . Alcohol use: Yes    Alcohol/week: 3.0 standard drinks    Types: 3 Cans of beer per week  . Drug use: Never  . Sexual activity: Yes  Other Topics Concern  . Not on  file  Social History Narrative  . Not on file   Social Determinants of Health   Financial Resource Strain:   . Difficulty of Paying Living Expenses: Not on file  Food Insecurity:   . Worried About Charity fundraiser in the Last Year: Not on file  . Ran Out of Food in the Last Year: Not on file  Transportation Needs:   . Lack of Transportation (Medical): Not on file  . Lack of Transportation (Non-Medical): Not on file  Physical Activity:   . Days of Exercise per Week: Not on file  . Minutes of Exercise per Session: Not on file  Stress:   . Feeling of Stress : Not on file  Social Connections:   . Frequency of Communication with Friends and Family: Not on file  . Frequency of Social Gatherings with Friends and Family: Not on file  . Attends Religious Services: Not on file  . Active Member of Clubs or Organizations: Not on file  . Attends Archivist Meetings: Not on file  . Marital Status: Not on file     Family History: The patient's family history includes Cancer in his maternal grandmother; Hypertension in his mother; Mental retardation in his mother; Stroke in his maternal grandfather.  ROS:   Please see the history of present illness.     All other systems reviewed and are negative.  EKGs/Labs/Other Studies Reviewed:    The following studies were reviewed today: Echocardiogram 01/04/2020  Study Result    ECHOCARDIOGRAM REPORT       Patient Name:  Ivan [name] Date of Exam: 01/04/2020  Medical Rec #: [id number]     Height:    71.0 in  Accession #:  [id number]    Weight:    248.0 lb  Date of Birth: 06-Oct-1965     BSA:     2.310 m  Patient Age:  46 years     BP:      142/98 mmHg  Patient Gender: M         HR:      82 bpm.  Exam Location: Inpatient   Procedure: 2D Echo, Cardiac Doppler and Color Doppler   Indications:  R55 Syncope    History:    Patient has no prior history of  Echocardiogram  examinations.    Sonographer:  Jonelle Sidle Dance  Referring Phys: [id number] [place]    1. Left ventricular ejection fraction, by estimation, is 60 to 65%. The  left ventricle has normal function. The left ventricle has no regional  wall motion abnormalities. There is mild left ventricular hypertrophy.  Left ventricular diastolic parameters  are consistent with Grade I diastolic dysfunction (impaired relaxation).  2. Right ventricular systolic function is normal. The right ventricular  size is normal.  3. The mitral valve is normal in structure. No evidence of mitral valve  regurgitation.  No evidence of mitral stenosis.  4. The aortic valve is normal in structure. Aortic valve regurgitation is  not visualized. No aortic stenosis is present.      EKG:  EKG is  ordered today.  The ekg ordered today demonstrates normal sinus rhythm, left axis deviation, left ventricular hypertrophy with prominent repolarization changes, primarily very broad and deep T wave inversion leads V3-V6, 1, aVL, QTC 422 ms  Recent Labs: 02/07/2020: ALT 26; BUN 16; Creatinine 1.30; Hemoglobin 14.4; Platelet Count 191; Potassium 4.2; Sodium 143  Recent Lipid Panel No results found for: CHOL, TRIG, HDL, CHOLHDL, VLDL, LDLCALC, LDLDIRECT 01/25/2019 Total cholesterol 168, HDL 30, triglycerides 97  Risk Assessment/Calculations:       Physical Exam:    VS:  BP 122/84 (BP Location: Left Arm, Patient Position: Sitting)   Pulse 65   Ht 5\' 11"  (0.623 m)   Wt 252 lb 8 oz (114.5 kg)   SpO2 96%   BMI 35.22 kg/m     Wt Readings from Last 3 Encounters:  03/06/20 252 lb 8 oz (114.5 kg)  02/07/20 245 lb 8 oz (111.4 kg)  01/04/20 248 lb 0.3 oz (112.5 kg)     GEN: Moderately obese, well nourished, well developed in no acute distress HEENT: Normal NECK: No JVD; No carotid bruits LYMPHATICS: No lymphadenopathy CARDIAC: RRR, no murmurs, rubs, gallops at rest.  He does have a  very faint 1/6 inducible aortic ejection murmur that is heard briefly following the Valsalva maneuver for only 2-3 weeks. RESPIRATORY:  Clear to auscultation without rales, wheezing or rhonchi  ABDOMEN: Soft, non-tender, non-distended MUSCULOSKELETAL:  No edema; No deformity  SKIN: Warm and dry NEUROLOGIC:  Alert and oriented x 3 PSYCHIATRIC:  Normal affect   ASSESSMENT:    1. Hypertrophic cardiomyopathy ([place])   2. Other chronic pulmonary embolism, unspecified whether acute cor pulmonale present ([place])   3. Moderate obesity   4. Dyslipidemia (high LDL; low HDL)   5. Prolactinoma ([place])    PLAN:    In order of problems listed above:  1. Apical variant HCM: I think that his ECG changes in the echo pattern fairly convincingly make this diagnosis.  He appears to have a very benign variant of hypertrophic cardiomyopathy without any family history of serious arrhythmia, heart failure or other adverse consequences.  He has only mild high atrophy.  He has not had any documented ventricular arrhythmia.  His single syncopal event can be attributed to his large pulmonary embolism.  No specific therapy is recommended at this time.  Genetic testing is offered primarily for his family members sake, but in view of the benign nature of the clinical course, he does not appear to interested. 2. Recent pulmonary embolism: Large bilateral embolism associated with a right femoral popliteal DVT.  This appears to be unprovoked.  He is heterozygous for the prothrombin [id number] mutation.  I would recommend lifelong anticoagulation. 3. Moderate obesity: He reports that he has already lost 25 pounds this year and has set a personal goal to get his weight down to 220 pounds.  He is encouraged to pursue this.  We discussed appropriate changes in his diet and physical activity. 4. Dyslipidemia: Primarily low HDL cholesterol with a modestly elevated LDL at 114.  Both these parameters are expected to improve with weight loss  and more physical exercise 5. Prolactinoma: Presented with tender gynecomastia, well controlled with cabergoline therapy    Shared Decision Making/Informed Consent      Medication Adjustments/Labs and  Tests Ordered: Current medicines are reviewed at length with the patient today.  Concerns regarding medicines are outlined above.  Orders Placed This Encounter  Procedures  . EKG 12-Lead   No orders of the defined types were placed in this encounter.   Patient Instructions  Medication Instructions:  No changes *If you need a refill on your cardiac medications before your next appointment, please call your pharmacy*   Lab Work: None ordered If you have labs (blood work) drawn today and your tests are completely normal, you will receive your results only by: Marland Kitchen MyChart Message (if you have MyChart) OR . A paper copy in the mail If you have any lab test that is abnormal or we need to change your treatment, we will call you to review the results.   Testing/Procedures: None ordered   Follow-Up: At [facility], you and your health needs are our priority.  As part of our continuing mission to provide you with exceptional heart care, we have created designated Provider Care Teams.  These Care Teams include your primary Cardiologist (physician) and Advanced Practice Providers (APPs -  Physician Assistants and Nurse Practitioners) who all work together to provide you with the care you need, when you need it.  We recommend signing up for the patient portal called "MyChart".  Sign up information is provided on this After Visit Summary.  MyChart is used to connect with patients for Virtual Visits (Telemedicine).  Patients are able to view lab/test results, encounter notes, upcoming appointments, etc.  Non-urgent messages can be sent to your provider as well.   To learn more about what you can do with MyChart, go to [email or web address].    Your next appointment:   12 month(s)  The  format for your next appointment:   In Person  Provider:   You may see Sanda Klein, MD or one of the following Advanced Practice Providers on your designated Care Team:    Almyra Deforest, PA-C  Fabian Sharp, [place] or   Roby Lofts, PA-C       Signed, Sanda Klein, MD  03/08/2020 3:04 PM    [place]

## 2020-03-08 ENCOUNTER — Encounter: Payer: Self-pay | Admitting: Cardiovascular Disease

## 2020-06-20 ENCOUNTER — Other Ambulatory Visit: Payer: Self-pay

## 2020-06-20 ENCOUNTER — Ambulatory Visit: Payer: 59 | Attending: Internal Medicine

## 2020-06-20 DIAGNOSIS — Z23 Encounter for immunization: Secondary | ICD-10-CM

## 2020-06-20 NOTE — Progress Notes (Signed)
   Covid-19 Vaccination Clinic  Name:  DAJON LAZAR    MRN: [id number] DOB: [birth year]  06/20/2020  Mr. Gombert was observed post Covid-19 immunization for 15 minutes without incident. He was provided with Vaccine Information Sheet and instruction to access the V-Safe system.   Mr. Dartt was instructed to call 911 with any severe reactions post vaccine: Marland Kitchen Difficulty breathing  . Swelling of face and throat  . A fast heartbeat  . A bad rash all over body  . Dizziness and weakness   Immunizations Administered    Name Date Dose VIS Date Route   PFIZER Comrnaty(Gray TOP) Covid-19 Vaccine 06/20/2020  4:07 PM 0.3 mL 04/03/2020 Intramuscular   Manufacturer: [employer], [employer]   Lot: [id number]   NDC: [phone]

## 2020-07-06 ENCOUNTER — Other Ambulatory Visit: Payer: Self-pay | Admitting: Hematology and Oncology

## 2020-07-06 DIAGNOSIS — I2699 Other pulmonary embolism without acute cor pulmonale: Secondary | ICD-10-CM

## 2020-07-07 ENCOUNTER — Inpatient Hospital Stay: Payer: 59

## 2020-07-07 ENCOUNTER — Inpatient Hospital Stay: Payer: 59 | Attending: Hematology and Oncology | Admitting: Hematology and Oncology

## 2020-07-07 NOTE — Progress Notes (Signed)
No-show, will reschedule.

## 2020-07-08 ENCOUNTER — Telehealth: Payer: Self-pay | Admitting: Hematology and Oncology

## 2020-07-08 NOTE — Telephone Encounter (Signed)
Scheduled appt per 3/15 sch msg. Called pt, no answer. Left vm with appts date and times.

## 2020-08-04 ENCOUNTER — Other Ambulatory Visit: Payer: Self-pay

## 2020-08-04 ENCOUNTER — Inpatient Hospital Stay (HOSPITAL_BASED_OUTPATIENT_CLINIC_OR_DEPARTMENT_OTHER): Payer: 59 | Admitting: Hematology and Oncology

## 2020-08-04 ENCOUNTER — Inpatient Hospital Stay: Payer: 59 | Attending: Hematology and Oncology

## 2020-08-04 VITALS — BP 131/85 | HR 89 | Temp 97.8°F | Resp 18 | Ht 71.0 in | Wt 263.6 lb

## 2020-08-04 DIAGNOSIS — E291 Testicular hypofunction: Secondary | ICD-10-CM | POA: Diagnosis not present

## 2020-08-04 DIAGNOSIS — I2699 Other pulmonary embolism without acute cor pulmonale: Secondary | ICD-10-CM

## 2020-08-04 DIAGNOSIS — Z7901 Long term (current) use of anticoagulants: Secondary | ICD-10-CM | POA: Insufficient documentation

## 2020-08-04 DIAGNOSIS — I82401 Acute embolism and thrombosis of unspecified deep veins of right lower extremity: Secondary | ICD-10-CM | POA: Insufficient documentation

## 2020-08-04 DIAGNOSIS — Z79890 Hormone replacement therapy (postmenopausal): Secondary | ICD-10-CM | POA: Insufficient documentation

## 2020-08-04 DIAGNOSIS — D6852 Prothrombin gene mutation: Secondary | ICD-10-CM | POA: Insufficient documentation

## 2020-08-04 LAB — CMP (CANCER CENTER ONLY)
ALT: 31 U/L (ref 0–44)
AST: 30 U/L (ref 15–41)
Albumin: 4.1 g/dL (ref 3.5–5.0)
Alkaline Phosphatase: 65 U/L (ref 38–126)
Anion gap: 12 (ref 5–15)
BUN: 22 mg/dL — ABNORMAL HIGH (ref 6–20)
CO2: 25 mmol/L (ref 22–32)
Calcium: 9.1 mg/dL (ref 8.9–10.3)
Chloride: 107 mmol/L (ref 98–111)
Creatinine: 1.37 mg/dL — ABNORMAL HIGH (ref 0.61–1.24)
GFR, Estimated: >60 mL/min (ref >60)
Glucose, Bld: 109 mg/dL — ABNORMAL HIGH (ref 70–99)
Potassium: 4 mmol/L (ref 3.5–5.1)
Sodium: 144 mmol/L (ref 135–145)
Total Bilirubin: 0.3 mg/dL (ref 0.3–1.2)
Total Protein: 7.5 g/dL (ref 6.5–8.1)

## 2020-08-04 LAB — CBC WITH DIFFERENTIAL (CANCER CENTER ONLY)
Abs Immature Granulocytes: 0.02 10*3/uL (ref 0.00–0.07)
Basophils Absolute: 0 10*3/uL (ref 0.0–0.1)
Basophils Relative: 1 %
Eosinophils Absolute: 0.2 10*3/uL (ref 0.0–0.5)
Eosinophils Relative: 4 %
HCT: 41.1 % (ref 39.0–52.0)
Hemoglobin: 13.4 g/dL (ref 13.0–17.0)
Immature Granulocytes: 0 %
Lymphocytes Relative: 33 %
Lymphs Abs: 1.9 10*3/uL (ref 0.7–4.0)
MCH: 29.4 pg (ref 26.0–34.0)
MCHC: 32.6 g/dL (ref 30.0–36.0)
MCV: 90.1 fL (ref 80.0–100.0)
Monocytes Absolute: 0.7 10*3/uL (ref 0.1–1.0)
Monocytes Relative: 13 %
Neutro Abs: 2.9 10*3/uL (ref 1.7–7.7)
Neutrophils Relative %: 49 %
Platelet Count: 208 10*3/uL (ref 150–400)
RBC: 4.56 MIL/uL (ref 4.22–5.81)
RDW: 12.6 % (ref 11.5–15.5)
WBC Count: 5.9 10*3/uL (ref 4.0–10.5)
nRBC: 0 % (ref 0.0–0.2)

## 2020-08-04 NOTE — Progress Notes (Signed)
[facility] [phone]   [phone]  PROGRESS NOTE  Patient Care Team: Scifres, Durel Salts as PCP - General (Physician Assistant) Sanda Klein, MD as PCP - Cardiology (Cardiology)  Hematological/Oncological History # Bilateral Pulmonary Embolism # Heterozygous for Prothrombin Gene Mutation.  01/03/2020: CTA showed acute bilateral pulmonary embolus with filling defects involving both main pulmonary arteries. Thromboembolic burden was moderate to large 01/04/2020: [place] Lower extremity showed with acute deep vein thrombosis involving the right  femoral vein, right popliteal vein, right posterior tibial veins, right peroneal veins, and right gastrocnemius veins 01/04/2020: extensive hypercoagulation workup showed patient was heterozygous for Prothrombin gene mutation. D/c on Xarelto therapy.  02/07/2020: establish care with Dr. Lorenso Courier   Interval History:  Ivan [name] 55 y.o. male with medical history significant for bilateral pulmonary emboli who presents for a follow up visit. The patient's last visit was on 02/06/2021 at which time he established care. In the interim since the last visit he has continued on Xarelto without issue.  On exam today Ivan [name] reports that he has had no issues with bleeding, bruising, or dark stools since his last visit.  He is also had no swelling in his lower extremities or pain or shortness of breath.  He notes that he has been tolerating his Xarelto well and has not missed any doses.  He reports the supplies only bout $35 a month and is not financially burdensome to him.  He otherwise denies any fevers, chills, sweats, nausea, vomiting or diarrhea.  A full 10 point ROS is listed below.  On exam today we discussed the option of transitioning to maintenance dose Xarelto 10 mg p.o. daily.  The patient would like to continue full-strength Xarelto 20 mg p.o. daily as he is concerned about recurrent VTE.  This is a reasonable  option, however if moving forward he would like to change to maintenance dose would be happy to accommodate that request.  MEDICAL HISTORY:  Past Medical History:  Diagnosis Date  . Degenerative joint disease (DJD) of hip    Left  . G6PD deficiency   . Pituitary tumor 2017   "took RX to shrink it" (11/22/2017)  . Seasonal allergies    "spring; pollen" (11/22/2017)    SURGICAL HISTORY: Past Surgical History:  Procedure Laterality Date  . TOTAL HIP ARTHROPLASTY Left 11/22/2017   Procedure: TOTAL HIP ARTHROPLASTY ANTERIOR APPROACH;  Surgeon: Melrose Nakayama, MD;  Location: [facility];  Service: Orthopedics;  Laterality: Left;    SOCIAL HISTORY: Social History   Socioeconomic History  . Marital status: Married    Spouse name: Not on file  . Number of children: 2  . Years of education: Not on file  . Highest education level: Not on file  Occupational History  . Not on file  Tobacco Use  . Smoking status: Never Smoker  . Smokeless tobacco: Never Used  Vaping Use  . Vaping Use: Never used  Substance and Sexual Activity  . Alcohol use: Yes    Alcohol/week: 3.0 standard drinks    Types: 3 Cans of beer per week  . Drug use: Never  . Sexual activity: Yes  Other Topics Concern  . Not on file  Social History Narrative  . Not on file   Social Determinants of Health   Financial Resource Strain: Not on file  Food Insecurity: Not on file  Transportation Needs: Not on file  Physical Activity: Not on file  Stress: Not on file  Social Connections: Not  on file  Intimate Partner Violence: Not on file    FAMILY HISTORY: Family History  Problem Relation Age of Onset  . Hypertension Mother   . Mental retardation Mother   . Cancer Maternal Grandmother   . Stroke Maternal Grandfather     ALLERGIES:  has No Known Allergies.  MEDICATIONS:  Current Outpatient Medications  Medication Sig Dispense Refill  . cabergoline (DOSTINEX) 0.5 MG tablet Take 2 tablets (1 mg total) by mouth  once a week. 10 tablet 0  . Multiple Vitamin (MULTIVITAMIN) capsule Take 1 capsule by mouth daily.    . rivaroxaban (XARELTO) 20 MG TABS tablet Take 20 mg by mouth daily with supper.     No current facility-administered medications for this visit.    REVIEW OF SYSTEMS:   Constitutional: ( - ) fevers, ( - )  chills , ( - ) night sweats Eyes: ( - ) blurriness of vision, ( - ) double vision, ( - ) watery eyes Ears, nose, mouth, throat, and face: ( - ) mucositis, ( - ) sore throat Respiratory: ( - ) cough, ( - ) dyspnea, ( - ) wheezes Cardiovascular: ( - ) palpitation, ( - ) chest discomfort, ( - ) lower extremity swelling Gastrointestinal:  ( - ) nausea, ( - ) heartburn, ( - ) change in bowel habits Skin: ( - ) abnormal skin rashes Lymphatics: ( - ) new lymphadenopathy, ( - ) easy bruising Neurological: ( - ) numbness, ( - ) tingling, ( - ) new weaknesses Behavioral/Psych: ( - ) mood change, ( - ) new changes  All other systems were reviewed with the patient and are negative.  PHYSICAL EXAMINATION: Vitals:   08/04/20 1542  BP: 131/85  Pulse: 89  Resp: 18  Temp: 97.8 F (36.6 C)  SpO2: 98%   Filed Weights   08/04/20 1542  Weight: 263 lb 9.6 oz (119.6 kg)    GENERAL: well appearing middle aged [place] American male alert, no distress and comfortable SKIN: skin color, texture, turgor are normal, no rashes or significant lesions EYES: conjunctiva are pink and non-injected, sclera clear LUNGS: clear to auscultation and percussion with normal breathing effort HEART: regular rate & rhythm and no murmurs and no lower extremity edema Musculoskeletal: no cyanosis of digits and no clubbing  PSYCH: alert & oriented x 3, fluent speech NEURO: no focal motor/sensory deficits  LABORATORY DATA:  I have reviewed the data as listed CBC Latest Ref Rng & Units 08/04/2020 02/07/2020 01/04/2020  WBC 4.0 - 10.5 K/uL 5.9 5.4 6.8  Hemoglobin 13.0 - 17.0 g/dL 13.4 14.4 14.8  Hematocrit 39.0 - 52.0 %  41.1 44.0 45.4  Platelets 150 - 400 K/uL 208 191 199    CMP Latest Ref Rng & Units 08/04/2020 02/07/2020 01/04/2020  Glucose 70 - 99 mg/dL 109(H) 85 101(H)  BUN 6 - 20 mg/dL 22(H) 16 11  Creatinine 0.61 - 1.24 mg/dL 1.37(H) 1.30(H) 1.11  Sodium 135 - 145 mmol/L 144 143 140  Potassium 3.5 - 5.1 mmol/L 4.0 4.2 3.9  Chloride 98 - 111 mmol/L 107 107 107  CO2 22 - 32 mmol/L 25 31 24   Calcium 8.9 - 10.3 mg/dL 9.1 10.0 9.0  Total Protein 6.5 - 8.1 g/dL 7.5 8.1 6.7  Total Bilirubin 0.3 - 1.2 mg/dL 0.3 0.9 1.2  Alkaline Phos 38 - 126 U/L 65 59 53  AST 15 - 41 U/L 30 28 30   ALT 0 - 44 U/L 31 26 33  RADIOGRAPHIC STUDIES: No results found.  ASSESSMENT & PLAN Ivan [name] 55 y.o. male with medical history significant for male hypogonadism, prolactinoma, G6P deficiency who presents for evaluation of recently diagnosed bilateral pulmonary embolism.  After review the labs, review the records, discussion with the patient the findings are most consistent with an unprovoked bilateral pulmonary embolism and right lower extremity DVT.  There may be numerous contributing factors to this including his heterozygous mutation for prothrombin gene and his exogenous testosterone supplementation due to his hypogonadism.  The patient denied having any clear inciting incidents such as prolonged travel, surgery, or trauma.  He is also not taking any other medications.  At this time would recommend proceeding with Xarelto 20 mg p.o. daily for at least 6 months duration with consideration of decreasing down to maintenance dosing after that time.  The patient will require lifelong anticoagulation in the setting of unprovoked bilateral pulmonary embolism.  There is no need to discontinue his exogenous testosterone as he is currently well protected from VTE on Xarelto therapy.  Additionally there are no other changes that need to be made in light of his prothrombin gene mutation.  # Unprovoked Bilateral Pulmonary  Embolism # Heterozygous for Prothrombin Gene Mutation  --recommend indefinite lifelong anticoagulation due to unprovoked large pulmonary embolism.  --continue Xarelto 20mg  PO daily.  --assure the patient has had age appropriate cancer screenings (colonoscopy)  --patient has completed 6 months of Xarelto therapy. He can decrease him from 20mg  Xarelto to 10mg   maintenance dosing if he chooses, however he notes today he would like to continue full dose Xarelto.  --baseline CBC and CMP today --plan for RTC in 6 months for continued monitoring   No orders of the defined types were placed in this encounter.   All questions were answered. The patient knows to call the clinic with any problems, questions or concerns.  A total of more than 30 minutes were spent on this encounter and over half of that time was spent on counseling and coordination of care as outlined above.   Ledell Peoples, MD Department of Hematology/Oncology [place] at [facility] Phone: [phone] Pager: [phone] Email: [email or web address]  08/04/2020 4:29 PM

## 2020-08-07 ENCOUNTER — Encounter: Payer: Self-pay | Admitting: Hematology and Oncology

## 2020-08-11 ENCOUNTER — Telehealth: Payer: Self-pay | Admitting: Hematology and Oncology

## 2020-08-11 NOTE — Telephone Encounter (Signed)
Left message with follow-up appointment per 4/11 los. Gave option to call back to reschedule if needed.

## 2020-08-12 ENCOUNTER — Encounter: Payer: Self-pay | Admitting: Hematology and Oncology

## 2021-02-02 ENCOUNTER — Inpatient Hospital Stay: Payer: 59 | Attending: Hematology and Oncology | Admitting: Hematology and Oncology

## 2021-02-02 ENCOUNTER — Inpatient Hospital Stay: Payer: 59

## 2021-02-02 ENCOUNTER — Other Ambulatory Visit: Payer: Self-pay | Admitting: Hematology and Oncology

## 2021-02-02 DIAGNOSIS — I2699 Other pulmonary embolism without acute cor pulmonale: Secondary | ICD-10-CM

## 2021-05-13 IMAGING — CT CT ANGIO CHEST
2 of 6 series · 18 of 46 positions shown · IV contrast (omnipaque)
Comparison: Chest radiograph yesterday.

CLINICAL DATA: PE suspected, high prob

Shortness of breath and exertional dyspnea.  Elevated D-dimer.
EXAM:
CT ANGIOGRAPHY CHEST WITH CONTRAST
TECHNIQUE: Multidetector CT imaging of the chest was performed using the
standard protocol during bolus administration of intravenous
contrast. Multiplanar CT image reconstructions and MIPs were
obtained to evaluate the vascular anatomy.
CONTRAST:  90mL OMNIPAQUE IOHEXOL 350 MG/ML SOLN

[Series 6: thins · axial · 0.70mm/px · z∈[-288,-47]mm · 15 of 265 slices shown]
[im 12/265  lung]
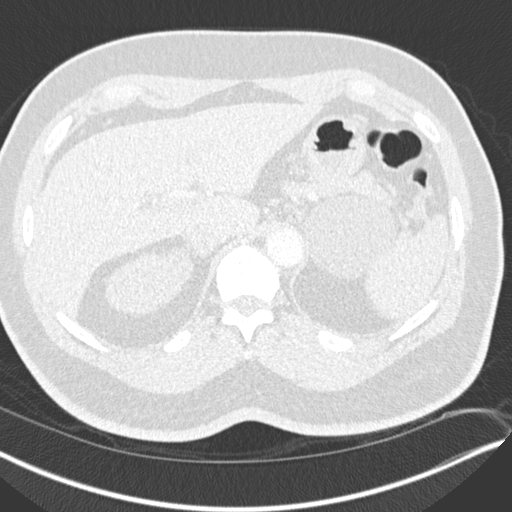
[im 35/265  soft-tissue]
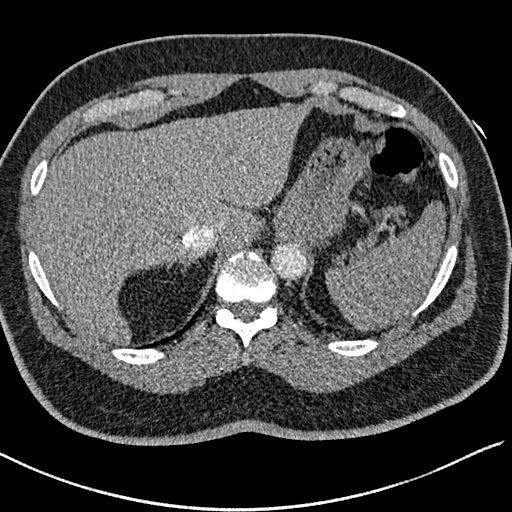
[im 46/265  lung]
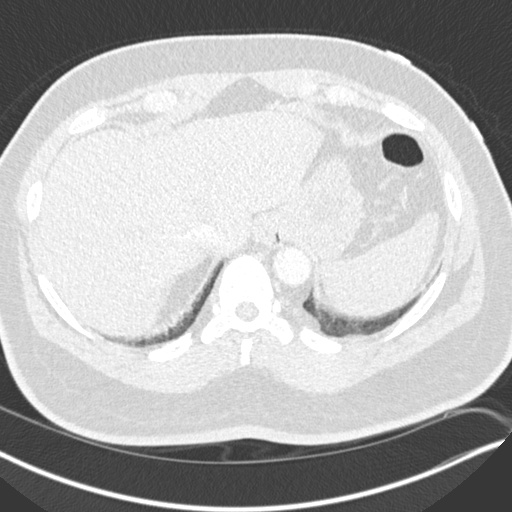
[im 69/265  soft-tissue]
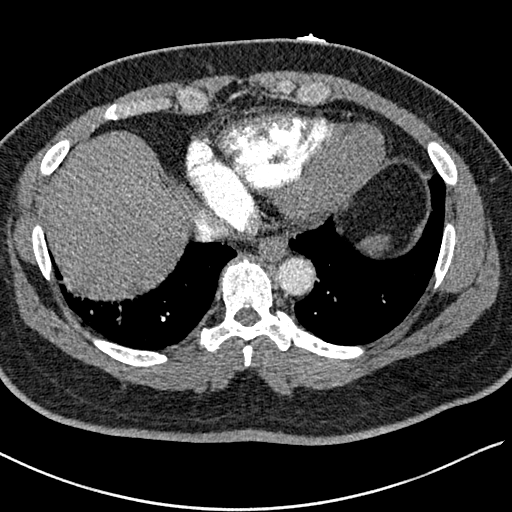
[im 81/265  lung]
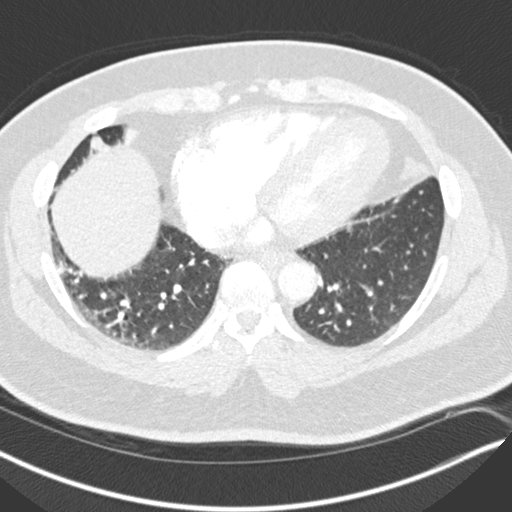
[im 104/265  soft-tissue]
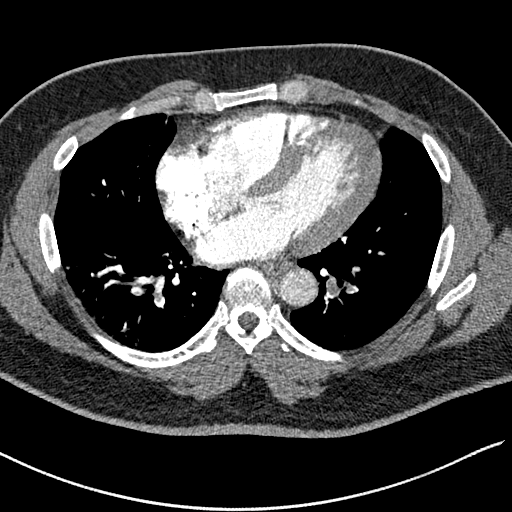
[im 115/265  lung]
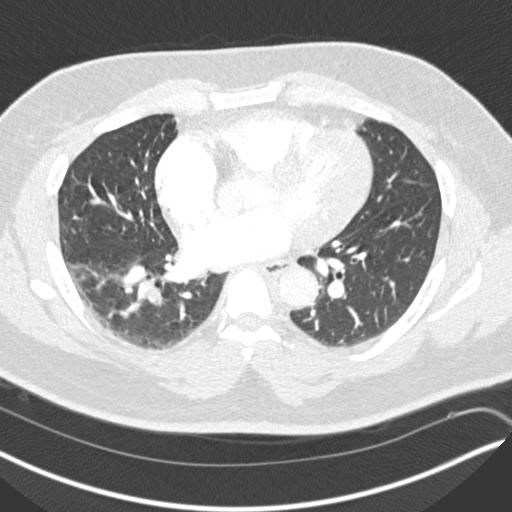
[im 138/265  soft-tissue]
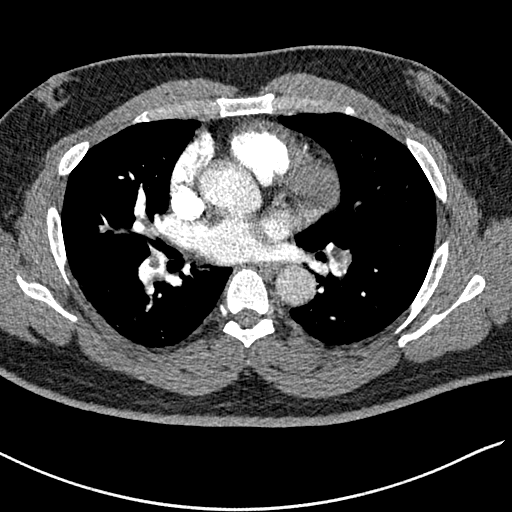
[im 150/265  lung]
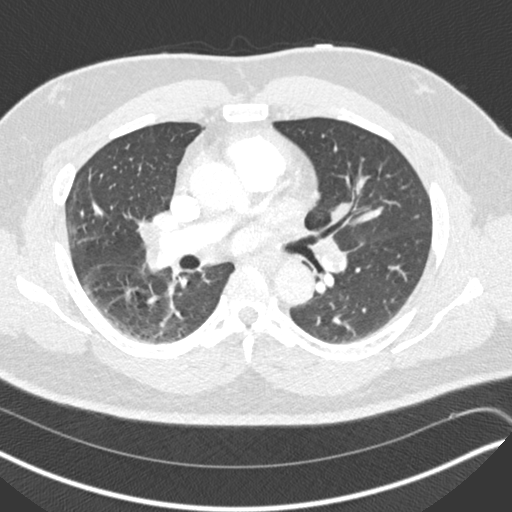
[im 161/265  soft-tissue]
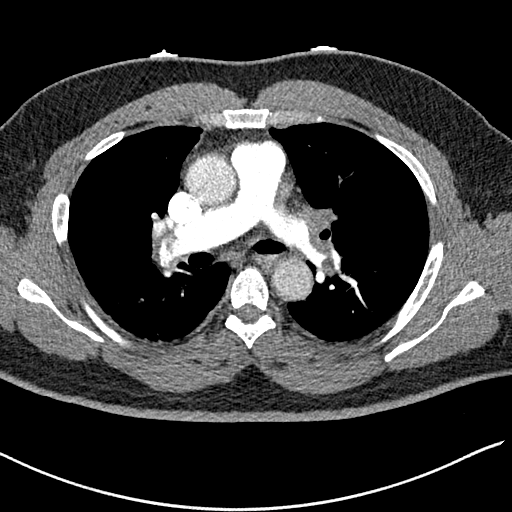
[im 184/265  lung]
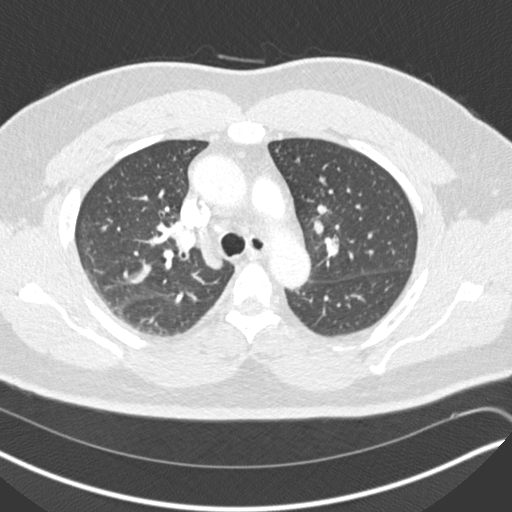
[im 196/265  soft-tissue]
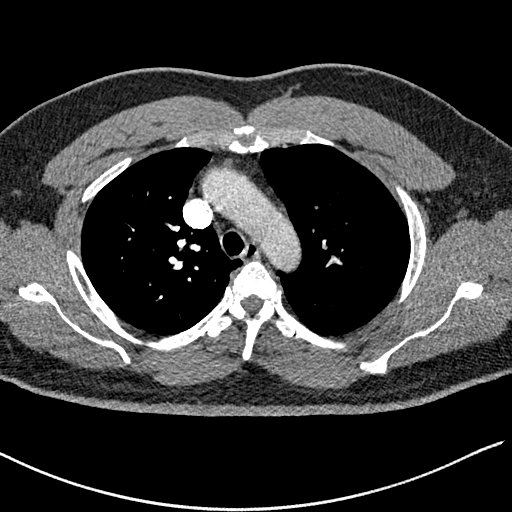
[im 219/265  lung]
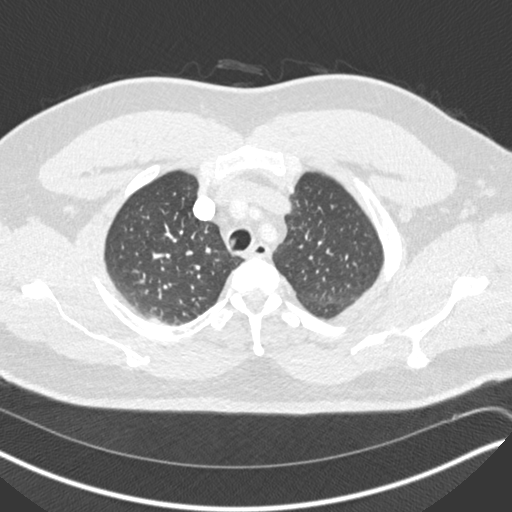
[im 230/265  soft-tissue]
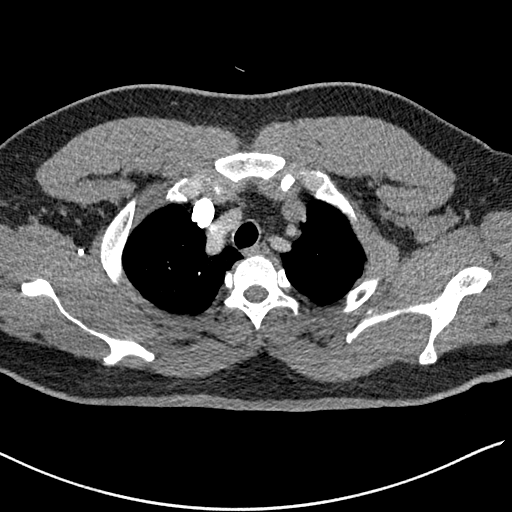
[im 253/265  lung]
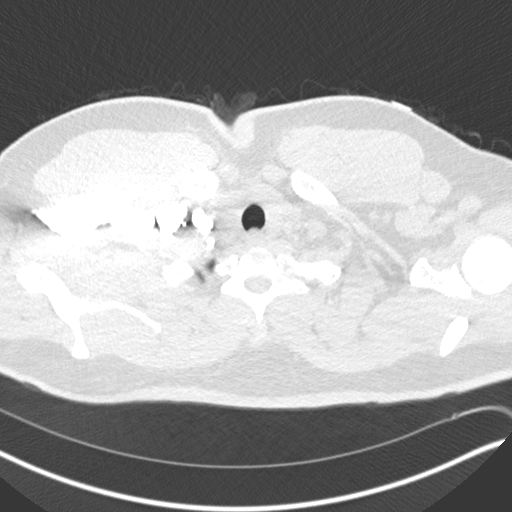

[Series 8: coronal mpr · coronal · 0.52mm/px · 3 of 123 slices shown]
[im 31/123  soft-tissue]
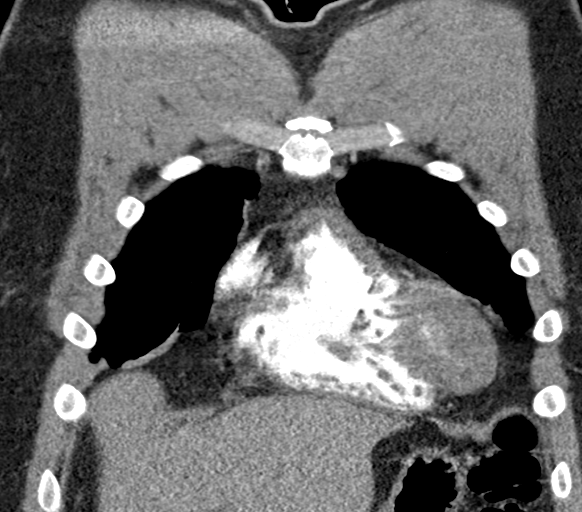
[im 62/123  soft-tissue]
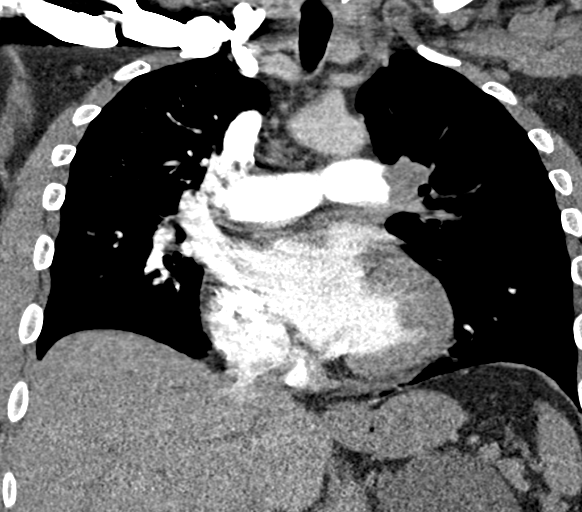
[im 92/123  soft-tissue]
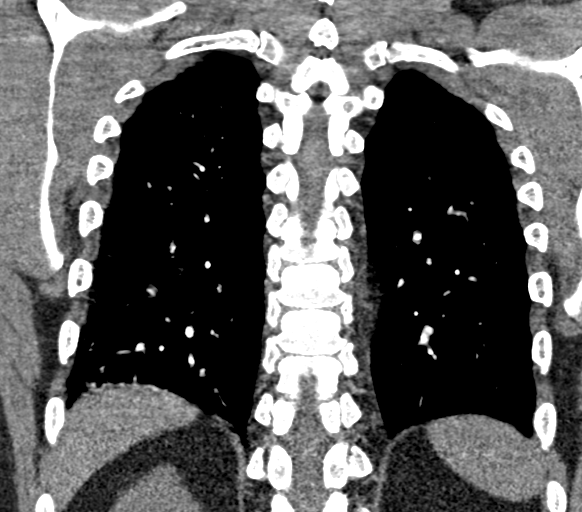

[18 of 46 positions shown; findings below may reference images not displayed]

FINDINGS: Cardiovascular: Positive for acute bilateral pulmonary embolus with
large bilateral pulmonary emboli. There are filling defects within
the distal bilateral main pulmonary arteries extending into all
lobar and many segmental branches. Thromboembolic burden is moderate
to large. There is no saddle embolus. No evidence of left atrial
thrombus. Elevated RV to LV ratio of 1.06. Mild straightening of the
intraventricular septum with minimal contrast refluxing into the
hepatic veins and IVC. Borderline cardiomegaly. There is no
pericardial effusion. Trace aortic atherosclerosis without
dissection. Conventional branching pattern from the aortic arch.

Mediastinum/Nodes: No mediastinal or hilar adenopathy. Tiny hiatal
hernia. No suspicious thyroid nodule.

Lungs/Pleura: Minimal subpleural opacity in the periphery of the
right lower lobe may represent small infarct. Additional
hypoventilatory changes in the dependent lungs. No confluent
airspace disease. No findings of pulmonary edema or pleural
effusion. Mild retained mucus in the trachea.

Upper Abdomen: Minimal contrast refluxing into the hepatic veins and
IVC. Bilateral renal cysts partially included.

Musculoskeletal: There are no acute or suspicious osseous
abnormalities.

Review of the MIP images confirms the above findings.
IMPRESSION: 1. Positive for acute bilateral pulmonary embolus with filling
defects involving both main pulmonary arteries. Thromboembolic
burden is moderate to large. Elevated RV to LV ratio of 1.06. Mild
straightening of the intraventricular septum with minimal contrast
refluxing into the hepatic veins and IVC, suggesting elevated right
heart pressures.
2. Minimal subpleural opacity in the periphery of the right lower
lobe may represent small infarct.

Aortic Atherosclerosis ([id number]-ODI.I).

Critical Value/emergent results were called by telephone at the time
of interpretation on 01/03/2020 at [DATE] to PA SAHITHI ROSENBERG ,
who verbally acknowledged these results.

## 2023-02-23 NOTE — Progress Notes (Signed)
Cardiology Office Note:  .   Date:  02/25/2023  ID:  Ivan [name], DOB December 19, 1965, MRN [id number] PCP: Scifres, Peter Minium (Inactive)  [facility] Providers Cardiologist:  Thurmon Fair, MD {  History of Present Illness: Ivan [name]   Ivan [name] is a 57 y.o. male hx of apical variant HCM, pulmonary embolism due to lower extremity DVT, prothrombin gene mutation heterozygote, dyslipidemia (low HDL), left hip osteoarthritis status post replacement, moderate obesity, hypoandrogenism,  prolactinoma treated with cabergoline. Last seen by Dr.Croitoru on 03/06/2020.   Since being seen last he has been doing fairly well.  He denies any significant shortness of breath, dizziness, palpitations, or chest discomfort.  He has been medically compliant.  He is very physically active walking 4 to 5 miles twice a week, also swims 2 days a week.  He is medically compliant.  Denies any bleeding issues on Xarelto.  ROS: As above otherwise negative.  Studies Reviewed: .   Echocardiogram 01/04/2020  Left ventricular ejection fraction, by estimation, is 60 to 65%. The  left ventricle has normal function. The left ventricle has no regional  wall motion abnormalities. There is mild left ventricular hypertrophy.  Left ventricular diastolic parameters  are consistent with Grade I diastolic dysfunction (impaired relaxation).   2. Right ventricular systolic function is normal. The right ventricular  size is normal.   3. The mitral valve is normal in structure. No evidence of mitral valve  regurgitation. No evidence of mitral stenosis.   4. The aortic valve is normal in structure. Aortic valve regurgitation is  not visualized. No aortic stenosis is present.      EKG Interpretation Date/Time:  Friday February 25 2023 08:34:53 EDT Ventricular Rate:  72 PR Interval:  186 QRS Duration:  100 QT Interval:  400 QTC Calculation: 438 R Axis:   -33  Text Interpretation: Normal sinus rhythm Left axis  deviation RSR' or QR pattern in V1 suggests right ventricular conduction delay Minimal voltage criteria for LVH, may be normal variant ( R in aVL ) Septal infarct , age undetermined Marked ST abnormality, possible inferior subendocardial injury When compared with ECG of 03-Jan-2020 13:38, RSR' pattern in V1 is now Present Septal infarct is now Present Confirmed by Joni Reining [id number]) on 02/25/2023 8:46:05 AM    Physical Exam:   VS:  BP 124/82 (BP Location: Left Arm, Patient Position: Sitting, Cuff Size: Normal)   Pulse 78   Ht 5\' 11"  (1.803 m)   Wt 273 lb (123.8 kg)   SpO2 95%   BMI 38.08 kg/m    Wt Readings from Last 3 Encounters:  02/25/23 273 lb (123.8 kg)  08/04/20 263 lb 9.6 oz (119.6 kg)  03/06/20 252 lb 8 oz (114.5 kg)    GEN: Well nourished, well developed in no acute distress NECK: No JVD; No carotid bruits CARDIAC: RRR  no murmurs, rubs, gallops RESPIRATORY:  Clear to auscultation without rales, wheezing or rhonchi  ABDOMEN: Soft, non-tender, non-distended EXTREMITIES:  No edema; No deformity   ASSESSMENT AND PLAN: .    Hypertrophic cardiomyopathy: Patient will have a repeat echocardiogram for comparison to prior study in 2021.  Although he is asymptomatic would like to have surveillance of pulmonary hypertension related to the PE.   2.  History of prothrombin gene mutation heterozygote: Continues on Xarelto 20 mg daily.  No issues with bleeding hemoptysis or melena.  Labs are followed by PCP along with refills.  3.  Hyperlipidemia: Followed by PCP.  Currently not  on statin therapy.  Diet controlled.   Signed, Bettey Mare. Liborio Nixon, ANP, AACC

## 2023-02-25 ENCOUNTER — Encounter: Payer: Self-pay | Admitting: Adult Health

## 2023-02-25 ENCOUNTER — Ambulatory Visit: Payer: 59 | Attending: Adult Health | Admitting: Adult Health

## 2023-02-25 VITALS — BP 124/82 | HR 78 | Ht 71.0 in | Wt 273.0 lb

## 2023-02-25 DIAGNOSIS — Z136 Encounter for screening for cardiovascular disorders: Secondary | ICD-10-CM | POA: Diagnosis not present

## 2023-02-25 DIAGNOSIS — I43 Cardiomyopathy in diseases classified elsewhere: Secondary | ICD-10-CM | POA: Diagnosis not present

## 2023-02-25 DIAGNOSIS — I422 Other hypertrophic cardiomyopathy: Secondary | ICD-10-CM | POA: Diagnosis not present

## 2023-02-25 DIAGNOSIS — E782 Mixed hyperlipidemia: Secondary | ICD-10-CM | POA: Diagnosis not present

## 2023-02-25 NOTE — Patient Instructions (Signed)
Medication Instructions:  No Changes *If you need a refill on your cardiac medications before your next appointment, please call your pharmacy*   Lab Work: No Labs If you have labs (blood work) drawn today and your tests are completely normal, you will receive your results only by: MyChart Message (if you have MyChart) OR A paper copy in the mail If you have any lab test that is abnormal or we need to change your treatment, we will call you to review the results.   Testing/Procedures: [address]. Your physician has requested that you have an echocardiogram. Echocardiography is a painless test that uses sound waves to create images of your heart. It provides your doctor with information about the size and shape of your heart and how well your heart's chambers and valves are working. This procedure takes approximately one hour. There are no restrictions for this procedure. Please do NOT wear cologne, perfume, aftershave, or lotions (deodorant is allowed). Please arrive 15 minutes prior to your appointment time.  Please note: We ask at that you not bring children with you during ultrasound (echo/ vascular) testing. Due to room size and safety concerns, children are not allowed in the ultrasound rooms during exams. Our front office staff cannot provide observation of children in our lobby area while testing is being conducted. An adult accompanying a patient to their appointment will only be allowed in the ultrasound room at the discretion of the ultrasound technician under special circumstances. We apologize for any inconvenience.    Follow-Up: At [facility], you and your health needs are our priority.  As part of our continuing mission to provide you with exceptional heart care, we have created designated Provider Care Teams.  These Care Teams include your primary Cardiologist (physician) and Advanced Practice Providers (APPs -  Physician Assistants and Nurse  Practitioners) who all work together to provide you with the care you need, when you need it.  We recommend signing up for the patient portal called "MyChart".  Sign up information is provided on this After Visit Summary.  MyChart is used to connect with patients for Virtual Visits (Telemedicine).  Patients are able to view lab/test results, encounter notes, upcoming appointments, etc.  Non-urgent messages can be sent to your provider as well.   To learn more about what you can do with MyChart, go to [email or web address].    Your next appointment:   1 year(s)  Provider:   Thurmon Fair, MD

## 2023-03-31 ENCOUNTER — Ambulatory Visit (HOSPITAL_COMMUNITY): Payer: 59

## 2023-04-26 ENCOUNTER — Ambulatory Visit (HOSPITAL_COMMUNITY): Payer: 59 | Attending: Cardiology

## 2023-04-26 DIAGNOSIS — Z136 Encounter for screening for cardiovascular disorders: Secondary | ICD-10-CM

## 2023-04-26 LAB — ECHOCARDIOGRAM COMPLETE
Area-P 1/2: 3.91 cm2
MV M vel: 5.04 m/s
MV Peak grad: 101.6 mm[Hg]
Radius: 0.9 cm
S' Lateral: 2.8 cm

## 2023-04-26 MED ORDER — PERFLUTREN LIPID MICROSPHERE
1.0000 mL | INTRAVENOUS | Status: AC | PRN
  Administered 2023-04-26: 2.5 mL via INTRAVENOUS

## 2023-04-28 ENCOUNTER — Telehealth: Payer: Self-pay

## 2023-04-28 NOTE — Telephone Encounter (Addendum)
 Results veiwed by patient via Mychart.----- Message from Lamarr Satterfield sent at 04/28/2023  7:41 AM EST ----- I have reviewed the echocardiogram report. Normal heart pumping function. Some enlargement of the heart muscle from high blood pressure. Severe mitral valves regurgitation (back flow through the mitral valve, which is located between the left atrium and the left ventricle).  I would like him to be seen by the heart valve clinic be evaluated for repair if he is willing.

## 2023-04-29 ENCOUNTER — Telehealth: Payer: Self-pay | Admitting: Adult Health

## 2023-04-29 DIAGNOSIS — I340 Nonrheumatic mitral (valve) insufficiency: Secondary | ICD-10-CM

## 2023-04-29 DIAGNOSIS — Z01812 Encounter for preprocedural laboratory examination: Secondary | ICD-10-CM

## 2023-04-29 NOTE — Telephone Encounter (Signed)
 Called Mr. Glasco and spoke with he and his wife concerning his echocardiogram report.  Talk with him about severe mitral regurgitation seen on the report.  This is different from prior echocardiogram  completed on 01/04/2020 revealing no mitral valve regurgitation.  I have explained to him that we will be referring him to the heart valve clinic specialist to discuss this further.  The patient is currently not complaining of any chest pain or shortness of breath but he has been noticing more fatigue lately.  He has not been noticing any lower extremity edema.  After multiple questions for answered the patient is willing to be seen by heart valve team for discussion and plan if necessary for any repair at their discretion.  He is going out of town to [place] next week for a week and will be returning thereafter.  I have asked him to wear compression hose while on the plane.  If he has any significant swelling or issues please give us  a call.  He is to fill his medications before leaving.

## 2023-05-03 ENCOUNTER — Other Ambulatory Visit (HOSPITAL_COMMUNITY): Payer: 59

## 2023-05-08 NOTE — Telephone Encounter (Signed)
 There is indeed a big change in his mitral valve since 2021. Before we refer him to the structural heart team, I think he should have a TEE first. Can we see if he is agreeable to a TEE?  I can do it on Jan 24. I can talk to him about it by phone. Thanks

## 2023-05-09 NOTE — Telephone Encounter (Addendum)
 Left message with call back number. Also sending [employer].    MyChart Message: Good evening, Dr Francyne reviewed the notes from your visit with Ivan Satterfield, NP and feels that you would benefit from having a TEE- Transesophageal Echocardiogram before being referred to the structural heart team. If you are agreeable, he can perform it as soon as 05/20/23 and he can call you to discuss this procedure.  A cardiologist performs the procedure by inserting a small, flexible tube with an ultrasound transducer into the patient's mouth and down the esophagus. The transducer directs high-frequency sound waves to create images of the heart's structure and function. A TEE is often used when a doctor needs to examine the heart more closely. Because the esophagus is close to the heart, a TEE can provide clearer images than a standard echocardiogram, which is performed on the chest's surface.   Please let us  know what you wish to do. Thank you Izetta, RN

## 2023-05-09 NOTE — Telephone Encounter (Signed)
 Received a page from the wife and patient, there were somewhat frustrated and concerned regarding a message they just received.  They are currently at the airport flying out in 2 hours to [place].  They will return back to [place]  next Tuesday.  I explained to the patient that his wife that our nurse contacted him based on recommendation by Dr. Francyne to set up a transesophageal echocardiogram.  Family and the patient wished to know if they should cancel their flight right now and wait in [place]  for this urgent procedure.  I explained to the patient this is not an urgent procedure.  The purpose of TEE is to obtain more information regarding the severity of his mitral regurgitation prior to his structural heart evaluation.    Talking with the patient, he has no heart failure symptoms such as lower extremity or worsening shortness of breath with exertion.  In fact he was able to walk 4 miles this morning without any issue.  Given the fact the patient does not have any symptoms associated with severe mitral regurgitation, I discussed the case with DOD Dr. Redell Shallow, we both felt that he does not need to cancel his trip in order to wait for the TEE.  He may proceed to fly out today.  Patient is aware to contact us  if he does develop any heart failure symptoms.  Will notify of our nurse to contact the patient after next Tuesday once they return back to [place]  so a TEE can be scheduled.

## 2023-05-10 NOTE — Telephone Encounter (Signed)
 No problem, I reassured the patient it is not an emergent procedure. I think he got rattled since he was about to board a international plane and got a message about a procedure. He is good now

## 2023-05-10 NOTE — Telephone Encounter (Signed)
 Agree. I spoke with the patient last week and told them it was okay to go on there vacation to [place] as he was not symptomatic.  KL

## 2023-05-18 NOTE — Addendum Note (Signed)
Addended by: Scheryl Marten on: 05/18/2023 01:06 PM   Modules accepted: Orders

## 2023-06-02 NOTE — Progress Notes (Signed)
 Spoke to pt and instructed them to come at 0645 and to be NPO after 0000.   Confirmed that pt will have a ride home and someone to stay with them for 24 hours after the procedure. Instructed patient to not wear any jewelry or lotion.

## 2023-06-03 ENCOUNTER — Ambulatory Visit (HOSPITAL_COMMUNITY): Payer: 59 | Admitting: Anesthesiology

## 2023-06-03 ENCOUNTER — Encounter (HOSPITAL_COMMUNITY): Payer: Self-pay | Admitting: Cardiovascular Disease

## 2023-06-03 ENCOUNTER — Ambulatory Visit (HOSPITAL_COMMUNITY)
Admission: RE | Admit: 2023-06-03 | Discharge: 2023-06-03 | Disposition: A | Discharge status: Home / Self Care | Payer: 59 | Source: Ambulatory Visit | Attending: Cardiovascular Disease | Admitting: Cardiovascular Disease

## 2023-06-03 ENCOUNTER — Encounter (HOSPITAL_COMMUNITY)
Admission: RE | Disposition: A | Discharge status: Home / Self Care | Payer: Self-pay | Source: Ambulatory Visit | Attending: Cardiovascular Disease

## 2023-06-03 ENCOUNTER — Ambulatory Visit (HOSPITAL_BASED_OUTPATIENT_CLINIC_OR_DEPARTMENT_OTHER)
Admission: RE | Admit: 2023-06-03 | Discharge: 2023-06-03 | Disposition: A | Discharge status: Home / Self Care | Payer: 59 | Source: Ambulatory Visit | Attending: Cardiovascular Disease | Admitting: Cardiovascular Disease

## 2023-06-03 ENCOUNTER — Other Ambulatory Visit: Payer: Self-pay

## 2023-06-03 DIAGNOSIS — E785 Hyperlipidemia, unspecified: Secondary | ICD-10-CM | POA: Insufficient documentation

## 2023-06-03 DIAGNOSIS — Z86718 Personal history of other venous thrombosis and embolism: Secondary | ICD-10-CM | POA: Diagnosis not present

## 2023-06-03 DIAGNOSIS — I340 Nonrheumatic mitral (valve) insufficiency: Secondary | ICD-10-CM

## 2023-06-03 DIAGNOSIS — I080 Rheumatic disorders of both mitral and aortic valves: Secondary | ICD-10-CM | POA: Diagnosis present

## 2023-06-03 DIAGNOSIS — M1612 Unilateral primary osteoarthritis, left hip: Secondary | ICD-10-CM | POA: Diagnosis not present

## 2023-06-03 DIAGNOSIS — Z86711 Personal history of pulmonary embolism: Secondary | ICD-10-CM | POA: Diagnosis not present

## 2023-06-03 DIAGNOSIS — Z79899 Other long term (current) drug therapy: Secondary | ICD-10-CM | POA: Diagnosis not present

## 2023-06-03 HISTORY — PX: TRANSESOPHAGEAL ECHOCARDIOGRAM (CATH LAB): EP1270

## 2023-06-03 LAB — ECHO TEE
AV Mean grad: 2.8 mm[Hg]
AV Peak grad: 5.9 mm[Hg]
Ao pk vel: 1.22 m/s
Area-P 1/2: 5.82 cm2
MV M vel: 5.83 m/s
MV Peak grad: 136 mm[Hg]
Radius: 0.4 cm

## 2023-06-03 LAB — POCT I-STAT, CHEM 8
BUN: 15 mg/dL (ref 6–20)
Calcium, Ion: 1.22 mmol/L (ref 1.15–1.40)
Chloride: 105 mmol/L (ref 98–111)
Creatinine, Ser: 1.2 mg/dL (ref 0.61–1.24)
Glucose, Bld: 116 mg/dL — ABNORMAL HIGH (ref 70–99)
HCT: 41 % (ref 39.0–52.0)
Hemoglobin: 13.9 g/dL (ref 13.0–17.0)
Potassium: 4 mmol/L (ref 3.5–5.1)
Sodium: 143 mmol/L (ref 135–145)
TCO2: 26 mmol/L (ref 22–32)

## 2023-06-03 SURGERY — TRANSESOPHAGEAL ECHOCARDIOGRAM (TEE) (CATHLAB)
Anesthesia: Monitor Anesthesia Care

## 2023-06-03 MED ORDER — LIDOCAINE 2% (20 MG/ML) 5 ML SYRINGE
INTRAMUSCULAR | Status: DC | PRN
  Administered 2023-06-03: 60 mg via INTRAVENOUS

## 2023-06-03 MED ORDER — PROPOFOL 500 MG/50ML IV EMUL
INTRAVENOUS | Status: DC | PRN
  Administered 2023-06-03: 65 ug/kg/min via INTRAVENOUS

## 2023-06-03 MED ORDER — PROPOFOL 10 MG/ML IV BOLUS
INTRAVENOUS | Status: DC | PRN
  Administered 2023-06-03: 100 mg via INTRAVENOUS

## 2023-06-03 MED ORDER — PHENYLEPHRINE HCL-NACL 20-0.9 MG/250ML-% IV SOLN
INTRAVENOUS | Status: DC | PRN
  Administered 2023-06-03: 40 ug/min via INTRAVENOUS

## 2023-06-03 MED ORDER — ONDANSETRON HCL 4 MG/2ML IJ SOLN
INTRAMUSCULAR | Status: DC | PRN
  Administered 2023-06-03: 4 mg via INTRAVENOUS

## 2023-06-03 MED ORDER — SODIUM CHLORIDE 0.9 % IV SOLN: INTRAVENOUS | Status: DC

## 2023-06-03 NOTE — Transfer of Care (Signed)
 Immediate Anesthesia Transfer of Care Note  Patient: Ivan [name]  Procedure(s) Performed: TRANSESOPHAGEAL ECHOCARDIOGRAM (TEE) (CATHLAB)  Patient Location: PACU  Anesthesia Type:MAC  Level of Consciousness: sedated  Airway & Oxygen Therapy: Patient Spontanous Breathing  Post-op Assessment: Report given to RN  Post vital signs: Reviewed and stable  Last Vitals:  Vitals Value Taken Time  BP 104/62   Temp    Pulse 73 06/03/23 0811  Resp 14 06/03/23 0811  SpO2 98 % 06/03/23 0811  Vitals shown include unfiled device data.  Last Pain: There were no vitals filed for this visit.       Complications: No notable events documented.

## 2023-06-03 NOTE — Progress Notes (Signed)
 Echocardiogram Echocardiogram Transesophageal has been performed.  Ivan [name] 06/03/2023, 8:16 AM

## 2023-06-03 NOTE — H&P (Signed)
 Cardiology Admission History and Physical   Patient ID: Ivan [name] MRN: [id number]; DOB: 03-05-1966   Admission date: 06/03/2023  PCP:  Scifres, Naomie RIGGERS (Inactive)   [facility] Providers Cardiologist:  Jerel Balding, MD        Chief Complaint:  Mitral insufficiency  Patient Profile:   Ivan [name] is a 58 y.o. male with recently diagnosed severe MR who is being seen 06/03/2023 for TEE  History of Present Illness:   Ivan [name]  is a 58 y.o. male with apical variant HCM, a hx of pulmonary embolism due to lower extremity DVT, prothrombin gene mutation heterozygote, dyslipidemia (low HDL), left hip osteoarthritis status post replacement, moderate obesity, hypoandrogenism,  prolactinoma treated with cabergoline .   A follow up echo in November 2024 showed unexpected severe MR, new from 2021.   He walks 4-5 miles a few times a week, swims twice a week. The patient specifically denies any chest pain at rest or with exertion, dyspnea at rest or with exertion, orthopnea, paroxysmal nocturnal dyspnea, syncope, palpitations, focal neurological deficits, intermittent claudication, lower extremity edema, unexplained weight gain, cough, hemoptysis or wheezing.    Past Medical History:  Diagnosis Date   Degenerative joint disease (DJD) of hip    Left   G6PD deficiency    Pituitary tumor 2017   took RX to shrink it (11/22/2017)   Seasonal allergies    spring; pollen (11/22/2017)    Past Surgical History:  Procedure Laterality Date   TOTAL HIP ARTHROPLASTY Left 11/22/2017   Procedure: TOTAL HIP ARTHROPLASTY ANTERIOR APPROACH;  Surgeon: Sheril Coy, MD;  Location: [facility] OR;  Service: Orthopedics;  Laterality: Left;     Medications Prior to Admission: Prior to Admission medications   Medication Sig Start Date End Date Taking? Authorizing Provider  allopurinol (ZYLOPRIM) 100 MG tablet Take 200 mg by mouth daily.   Yes [provider]  cabergoline   (DOSTINEX ) 0.5 MG tablet Take 2 tablets (1 mg total) by mouth once a week. 01/04/20  Yes Vann, Jessica U, DO  Multiple Vitamin (MULTIVITAMIN) capsule Take 1 capsule by mouth daily. Gummy   Yes [provider]  rivaroxaban  (XARELTO ) 20 MG TABS tablet Take 20 mg by mouth daily with supper.   Yes [provider]     Allergies:   No Known Allergies  Social History:   Social History   Socioeconomic History   Marital status: Married    Spouse name: Not on file   Number of children: 2   Years of education: Not on file   Highest education level: Not on file  Occupational History   Not on file  Tobacco Use   Smoking status: Never   Smokeless tobacco: Never  Vaping Use   Vaping status: Never Used  Substance and Sexual Activity   Alcohol use: Yes    Alcohol/week: 3.0 standard drinks of alcohol    Types: 3 Cans of beer per week   Drug use: Never   Sexual activity: Yes  Other Topics Concern   Not on file  Social History Narrative   Not on file   Social Drivers of Health   Financial Resource Strain: Not on file  Food Insecurity: Not on file  Transportation Needs: Not on file  Physical Activity: Not on file  Stress: Not on file  Social Connections: Not on file  Intimate Partner Violence: Not on file    Family History:   The patient's family history includes Cancer in  his maternal grandmother; Hypertension in his mother; Mental retardation in his mother; Stroke in his maternal grandfather.    ROS:  Please see the history of present illness.  All other ROS reviewed and negative.     Physical Exam/Data:   Vitals:   06/03/23 0725  BP: 119/81  Pulse: 75  Resp: 16  Temp: 98.1 F (36.7 C)  SpO2: 95%  Weight: 120.2 kg  Height: 5' 11 (1.803 m)   No intake or output data in the 24 hours ending 06/03/23 0749    06/03/2023    7:25 AM 02/25/2023    8:29 AM 08/04/2020    3:42 PM  Last 3 Weights  Weight (lbs) 265 lb 273 lb 263 lb 9.6 oz  Weight (kg) 120.203 kg  123.832 kg 119.568 kg     Body mass index is 36.96 kg/m.  General:  Well nourished, well developed, in no acute distressObese HEENT: normal Neck: no JVD Vascular: No carotid bruits; Distal pulses 2+ bilaterally   Cardiac:  normal S1, S2; RRR; faint apical holosystolic murmur, no diastolic  murmur  Lungs:  clear to auscultation bilaterally, no wheezing, rhonchi or rales  Abd: soft, nontender, no hepatomegaly  Ext: no edema Musculoskeletal:  No deformities, BUE and BLE strength normal and equal Skin: warm and dry  Neuro:  CNs 2-12 intact, no focal abnormalities noted Psych:  Normal affect    Relevant CV Studies: TTE 04/26/2023    1. Left ventricular ejection fraction, by estimation, is 55 to 60%. The  left ventricle has normal function. The left ventricle has no regional  wall motion abnormalities. There is moderate concentric left ventricular  hypertrophy. On contrasted imaging,  there is obliteration of the LV apex consistent with possible apical  hypertrophy cardiomyopathy. GLS -13%. No SAM identified on m-mode.   2. Right ventricular systolic function is normal. The right ventricular  size is normal.   3. The mitral valve is normal in structure. Severe mitral valve  regurgitation.   4. The aortic valve is grossly normal. Aortic valve regurgitation is  trivial.   5. The tricuspid valve is normal. There is trivial tricuspid  regurgitation.   6. The inferior vena cava is normal in size with greater than 50%  respiratory variability, suggesting right atrial pressure of 3 mmHg.    Laboratory Data:  High Sensitivity Troponin:  No results for input(s): TROPONINIHS in the last 720 hours.    Chemistry Recent Labs  Lab 06/03/23 0740  NA 143  K 4.0  CL 105  GLUCOSE 116*  BUN 15  CREATININE 1.20    No results for input(s): PROT, ALBUMIN, AST, ALT, ALKPHOS, BILITOT in the last 168 hours. Lipids No results for input(s): CHOL, TRIG, HDL, LABVLDL,  LDLCALC, CHOLHDL in the last 168 hours. Hematology Recent Labs  Lab 06/03/23 0740  HGB 13.9  HCT 41.0   Thyroid  No results for input(s): TSH, FREET4 in the last 168 hours. BNPNo results for input(s): BNP, PROBNP in the last 168 hours.  DDimer No results for input(s): DDIMER in the last 168 hours.   Radiology/Studies:  No results found.   Assessment and Plan:   Severe MR by TTE. Plan TEE for better understanding of mechanism and to clarify severity.   Risk Assessment/Risk Scores:       Informed Consent   Shared Decision Making/Informed Consent   The risks [esophageal damage, perforation (1:10,000 risk), bleeding, pharyngeal hematoma as well as other potential complications associated with conscious sedation including aspiration,  arrhythmia, respiratory failure and death], benefits (treatment guidance and diagnostic support) and alternatives of a transesophageal echocardiogram were discussed in detail with Mr. Heilman and he is willing to proceed.         Code Status: Full Code   For questions or updates, please contact [facility] Please consult [email or web address] for contact info under     Signed, Jerel Balding, MD  06/03/2023 7:49 AM

## 2023-06-03 NOTE — Op Note (Signed)
 INDICATIONS: Mitral insufficiency  PROCEDURE:   Informed consent was obtained prior to the procedure. The risks, benefits and alternatives for the procedure were discussed and the patient comprehended these risks.  Risks include, but are not limited to, cough, sore throat, vomiting, nausea, somnolence, esophageal and stomach trauma or perforation, bleeding, low blood pressure, aspiration, pneumonia, infection, trauma to the teeth and death.    After a procedural time-out, the oropharynx was anesthetized with 20% benzocaine spray.   During this procedure the patient was administeredIV propofol  by Anesthesiology, Dr. Epifanio.  The transesophageal probe was inserted in the esophagus and stomach without difficulty and multiple views were obtained.  The patient was kept under observation until the patient left the procedure room.  The patient left the procedure room in stable condition.   Agitated microbubble saline contrast was not administered.  COMPLICATIONS:    There were no immediate complications.  FINDINGS:  Moderate central MR. Postinflammatory changes of the mitral leaflets whiich appear thickened and restricted. No significant MS.  RECOMMENDATIONS:     Monitor with TTE.  Time Spent Directly with the Patient:  45 minutes   Bernardina Cacho 06/03/2023, 8:11 AM

## 2023-06-03 NOTE — Anesthesia Postprocedure Evaluation (Signed)
 Anesthesia Post Note  Patient: Ivan [name]  Procedure(s) Performed: TRANSESOPHAGEAL ECHOCARDIOGRAM (TEE) (CATHLAB)     Patient location during evaluation: PACU Anesthesia Type: MAC Level of consciousness: awake and alert Pain management: pain level controlled Vital Signs Assessment: post-procedure vital signs reviewed and stable Respiratory status: spontaneous breathing, nonlabored ventilation, respiratory function stable and patient connected to nasal cannula oxygen Cardiovascular status: stable and blood pressure returned to baseline Postop Assessment: no apparent nausea or vomiting Anesthetic complications: no  No notable events documented.  Last Vitals:  Vitals:   06/03/23 0830 06/03/23 0840  BP: 114/69 108/73  Pulse: 76 72  Resp: 14 15  Temp:    SpO2: 94% 94%    Last Pain:  Vitals:   06/03/23 0840  TempSrc:   PainSc: 0-No pain                 Epifanio Lamar FORBES

## 2023-06-03 NOTE — Anesthesia Preprocedure Evaluation (Signed)
 Anesthesia Evaluation  Patient identified by MRN, date of birth, ID band Patient awake    Reviewed: Allergy & Precautions, NPO status , Patient's Chart, lab work & pertinent test results  Airway Mallampati: II  TM Distance: >3 FB     Dental   Pulmonary neg pulmonary ROS   breath sounds clear to auscultation       Cardiovascular + Valvular Problems/Murmurs MR  Rhythm:Regular Rate:Normal     Neuro/Psych negative neurological ROS     GI/Hepatic negative GI ROS, Neg liver ROS,,,  Endo/Other  negative endocrine ROS    Renal/GU negative Renal ROS     Musculoskeletal  (+) Arthritis ,    Abdominal   Peds  Hematology negative hematology ROS (+)   Anesthesia Other Findings   Reproductive/Obstetrics                             Anesthesia Physical Anesthesia Plan  ASA: 2  Anesthesia Plan: MAC   Post-op Pain Management:    Induction:   PONV Risk Score and Plan: 1 and Propofol  infusion  Airway Management Planned: Natural Airway and Nasal Cannula  Additional Equipment:   Intra-op Plan:   Post-operative Plan:   Informed Consent: I have reviewed the patients History and Physical, chart, labs and discussed the procedure including the risks, benefits and alternatives for the proposed anesthesia with the patient or authorized representative who has indicated his/her understanding and acceptance.       Plan Discussed with:   Anesthesia Plan Comments:        Anesthesia Quick Evaluation

## 2023-06-08 ENCOUNTER — Telehealth: Payer: Self-pay

## 2023-06-08 DIAGNOSIS — I43 Cardiomyopathy in diseases classified elsewhere: Secondary | ICD-10-CM

## 2023-06-08 DIAGNOSIS — I340 Nonrheumatic mitral (valve) insufficiency: Secondary | ICD-10-CM

## 2023-06-08 NOTE — Telephone Encounter (Signed)
Per Dr. Royann Shivers: Can we please have him get outpatient labs: anticardiolipin antibodies, CRP, ESR? And schedule for appointment with me in 6 months?   Attempted to call pt, voicemail is full, unable to leave message

## 2023-06-09 NOTE — Telephone Encounter (Signed)
Attempted to call pt, voice mail is full, unable to leave message.

## 2023-06-20 NOTE — Telephone Encounter (Signed)
 Attempted to call pt, voicemail is full, unable to leave message    Will send a letter to pt asking for a call back.

## 2023-07-14 ENCOUNTER — Other Ambulatory Visit: Payer: Self-pay

## 2023-07-14 DIAGNOSIS — Z01812 Encounter for preprocedural laboratory examination: Secondary | ICD-10-CM

## 2023-07-14 DIAGNOSIS — I340 Nonrheumatic mitral (valve) insufficiency: Secondary | ICD-10-CM

## 2023-07-14 LAB — CBC

## 2023-07-15 ENCOUNTER — Encounter: Payer: Self-pay | Admitting: Cardiovascular Disease

## 2023-07-15 LAB — CBC
Hematocrit: 43.1 % (ref 37.5–51.0)
Hemoglobin: 14 g/dL (ref 13.0–17.7)
MCH: 29.4 pg (ref 26.6–33.0)
MCHC: 32.5 g/dL (ref 31.5–35.7)
MCV: 91 fL (ref 79–97)
Platelets: 263 10*3/uL (ref 150–450)
RBC: 4.76 x10E6/uL (ref 4.14–5.80)
RDW: 11.9 % (ref 11.6–15.4)
WBC: 5.2 10*3/uL (ref 3.4–10.8)

## 2023-07-15 LAB — BASIC METABOLIC PANEL
BUN/Creatinine Ratio: 12 (ref 9–20)
BUN: 14 mg/dL (ref 6–24)
CO2: 27 mmol/L (ref 20–29)
Calcium: 9.6 mg/dL (ref 8.7–10.2)
Chloride: 104 mmol/L (ref 96–106)
Creatinine, Ser: 1.19 mg/dL (ref 0.76–1.27)
Glucose: 115 mg/dL — ABNORMAL HIGH (ref 70–99)
Potassium: 4.6 mmol/L (ref 3.5–5.2)
Sodium: 143 mmol/L (ref 134–144)
eGFR: 71 mL/min/{1.73_m2} (ref >59)
